# Patient Record
Sex: Female | Born: 1980 | Race: White | Hispanic: No | Marital: Married | Smoking: Never smoker
Health system: Southern US, Community
[De-identification: ages and names within clinical notes are randomized; demographics above are authoritative.]

## PROBLEM LIST (undated history)

## (undated) ENCOUNTER — Inpatient Hospital Stay (HOSPITAL_COMMUNITY): Payer: Self-pay

## (undated) DIAGNOSIS — Z8742 Personal history of other diseases of the female genital tract: Secondary | ICD-10-CM

## (undated) DIAGNOSIS — N859 Noninflammatory disorder of uterus, unspecified: Secondary | ICD-10-CM

## (undated) DIAGNOSIS — N809 Endometriosis, unspecified: Secondary | ICD-10-CM

## (undated) DIAGNOSIS — Z9889 Other specified postprocedural states: Secondary | ICD-10-CM

## (undated) DIAGNOSIS — K219 Gastro-esophageal reflux disease without esophagitis: Secondary | ICD-10-CM

## (undated) DIAGNOSIS — Z8619 Personal history of other infectious and parasitic diseases: Secondary | ICD-10-CM

## (undated) DIAGNOSIS — R112 Nausea with vomiting, unspecified: Secondary | ICD-10-CM

## (undated) DIAGNOSIS — Z973 Presence of spectacles and contact lenses: Secondary | ICD-10-CM

## (undated) DIAGNOSIS — R51 Headache: Secondary | ICD-10-CM

## (undated) HISTORY — PX: DILATION AND CURETTAGE OF UTERUS: SHX78

## (undated) HISTORY — PX: WISDOM TOOTH EXTRACTION: SHX21

## (undated) HISTORY — DX: Personal history of other infectious and parasitic diseases: Z86.19

---

## 2008-05-25 ENCOUNTER — Ambulatory Visit: Payer: Self-pay | Admitting: Family Medicine

## 2009-03-14 ENCOUNTER — Encounter: Payer: Self-pay | Admitting: Family Medicine

## 2009-03-29 ENCOUNTER — Ambulatory Visit: Payer: Self-pay | Admitting: Family Medicine

## 2009-03-29 DIAGNOSIS — K219 Gastro-esophageal reflux disease without esophagitis: Secondary | ICD-10-CM | POA: Insufficient documentation

## 2009-03-29 DIAGNOSIS — D179 Benign lipomatous neoplasm, unspecified: Secondary | ICD-10-CM | POA: Insufficient documentation

## 2009-03-29 DIAGNOSIS — R5383 Other fatigue: Secondary | ICD-10-CM

## 2009-03-29 DIAGNOSIS — R5381 Other malaise: Secondary | ICD-10-CM | POA: Insufficient documentation

## 2009-03-29 LAB — CONVERTED CEMR LAB
Nitrite: NEGATIVE
Specific Gravity, Urine: 1.025
Urobilinogen, UA: NEGATIVE
WBC Urine, dipstick: NEGATIVE

## 2009-04-01 ENCOUNTER — Encounter (INDEPENDENT_AMBULATORY_CARE_PROVIDER_SITE_OTHER): Payer: Self-pay | Admitting: *Deleted

## 2009-04-01 LAB — CONVERTED CEMR LAB
Basophils Absolute: 0 10*3/uL (ref 0.0–0.1)
Basophils Relative: 0 % (ref 0–1)
CO2: 24 meq/L (ref 19–32)
Calcium: 9.3 mg/dL (ref 8.4–10.5)
Creatinine, Ser: 0.72 mg/dL (ref 0.40–1.20)
Eosinophils Absolute: 0.1 10*3/uL (ref 0.0–0.7)
Folate: >20 ng/mL
Glucose, Bld: 100 mg/dL — ABNORMAL HIGH (ref 70–99)
Hemoglobin: 13.3 g/dL (ref 12.0–15.0)
MCHC: 34.1 g/dL (ref 30.0–36.0)
MCV: 87.1 fL (ref 78.0–100.0)
Monocytes Absolute: 0.5 10*3/uL (ref 0.1–1.0)
Monocytes Relative: 7 % (ref 3–12)
Neutro Abs: 5.4 10*3/uL (ref 1.7–7.7)
Neutrophils Relative %: 70 % (ref 43–77)
RDW: 12.7 % (ref 11.5–15.5)
Sodium: 139 meq/L (ref 135–145)
TSH: 2.164 microintl units/mL (ref 0.350–4.500)
Vit D, 25-Hydroxy: 30 ng/mL (ref 30–89)

## 2009-04-05 ENCOUNTER — Emergency Department (HOSPITAL_COMMUNITY): Admission: EM | Admit: 2009-04-05 | Discharge: 2009-04-05 | Payer: Self-pay | Admitting: Emergency Medicine

## 2009-07-25 ENCOUNTER — Ambulatory Visit (HOSPITAL_COMMUNITY): Admission: RE | Admit: 2009-07-25 | Discharge: 2009-07-25 | Payer: Self-pay | Admitting: Obstetrics and Gynecology

## 2009-07-25 HISTORY — PX: OTHER SURGICAL HISTORY: SHX169

## 2010-12-21 IMAGING — CR DG ABDOMEN ACUTE W/ 1V CHEST
4 series · 4 of 4 positions shown · non-contrast
Comparison: None

CLINICAL DATA: Abdominal and back pain.

ACUTE ABDOMEN SERIES (ABDOMEN 2 VIEW & CHEST 1 VIEW)

[w chest pa]
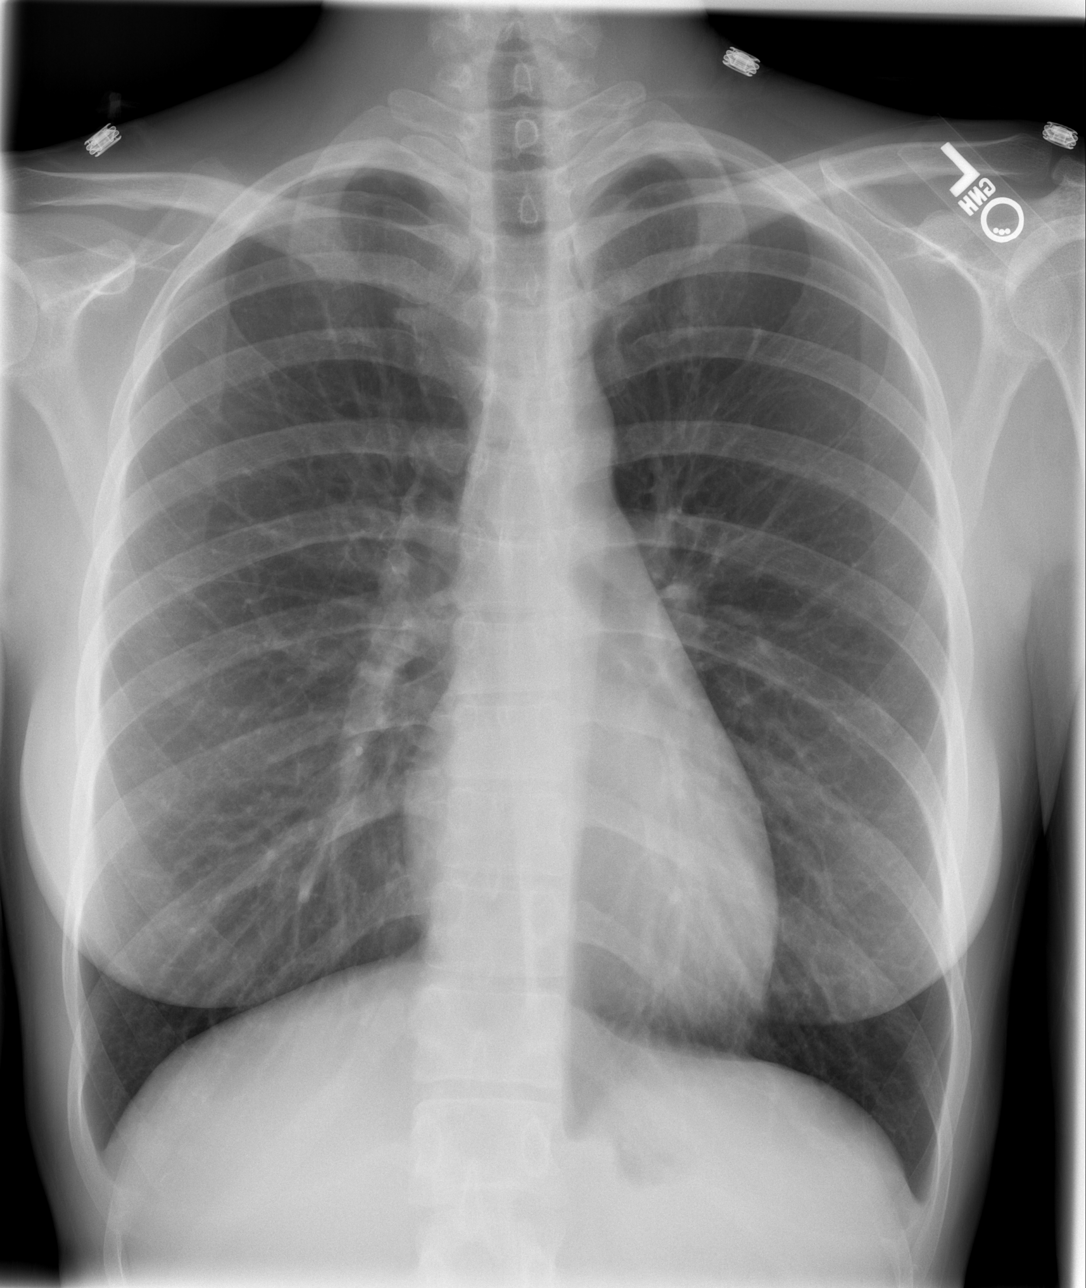

[w abdomen upright]
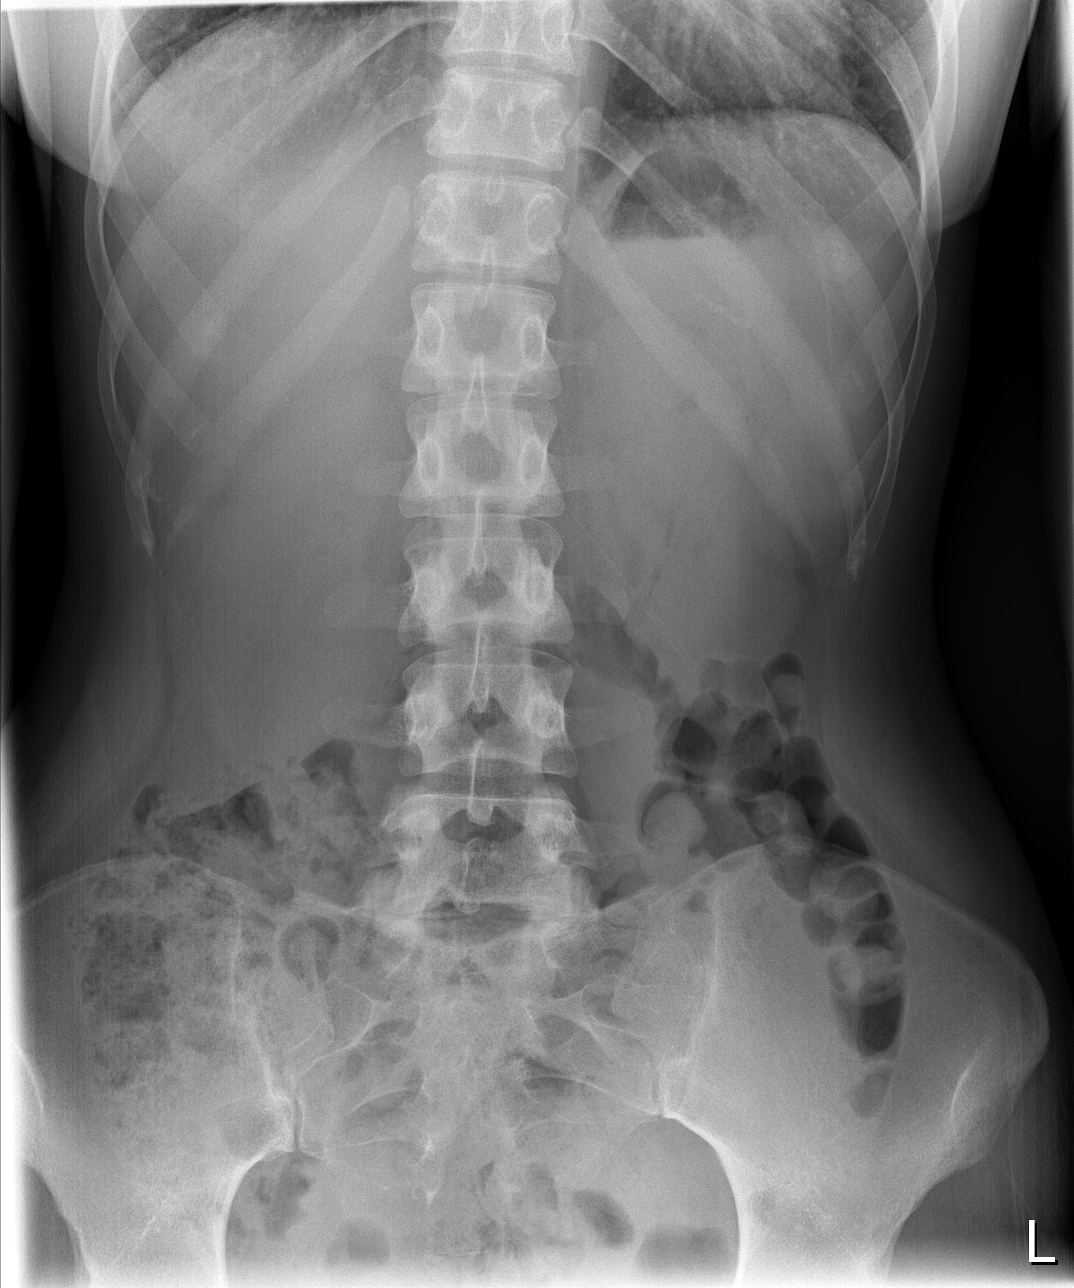

[t abdomen supine (1 of 2)]
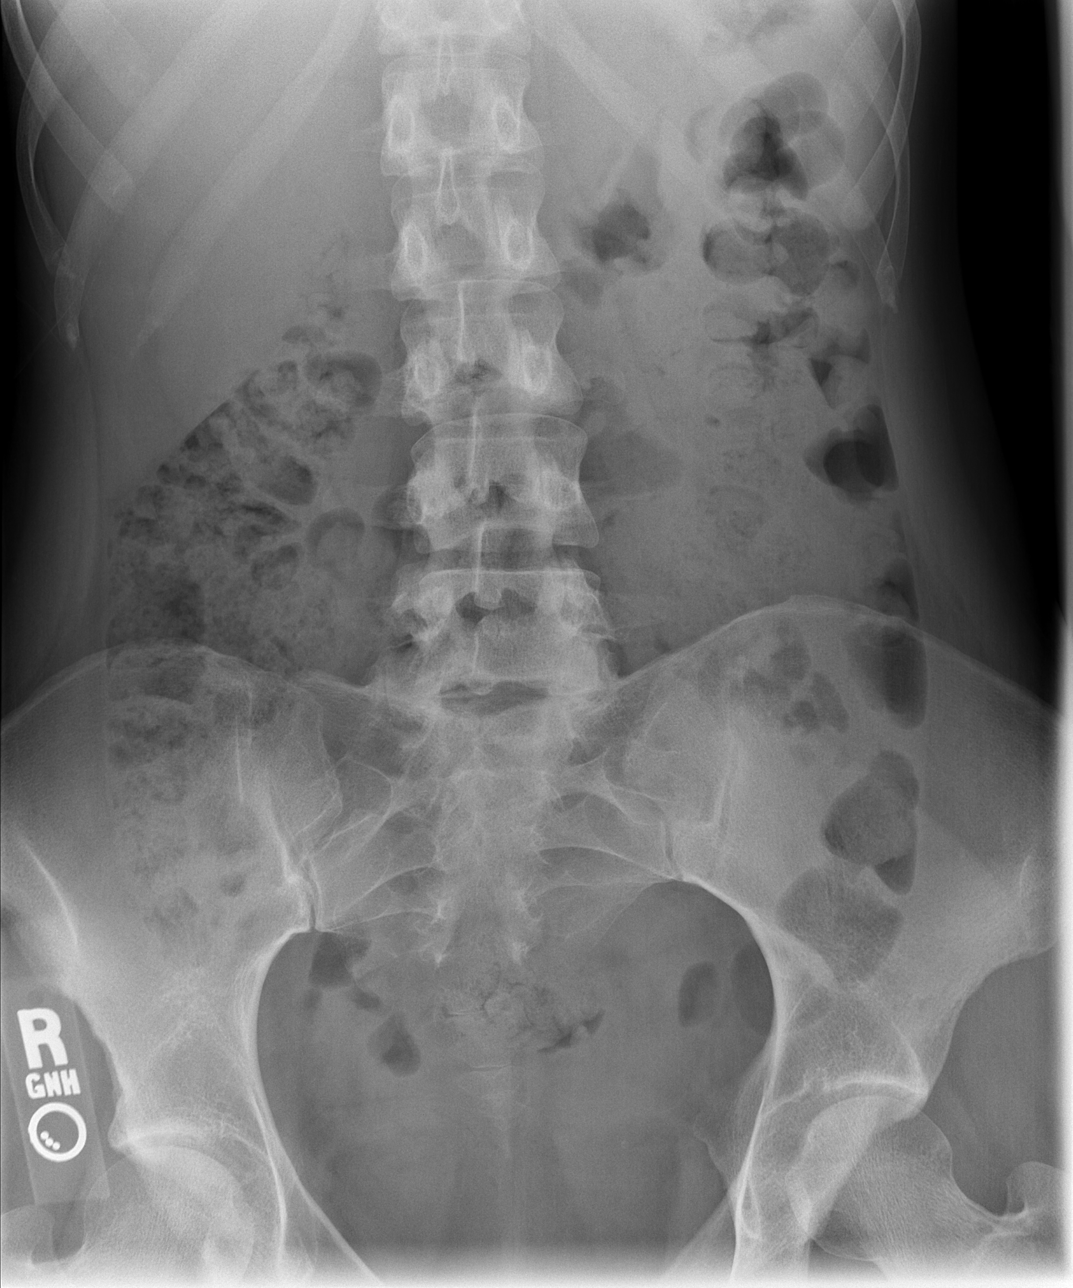

[t abdomen supine (2 of 2)]
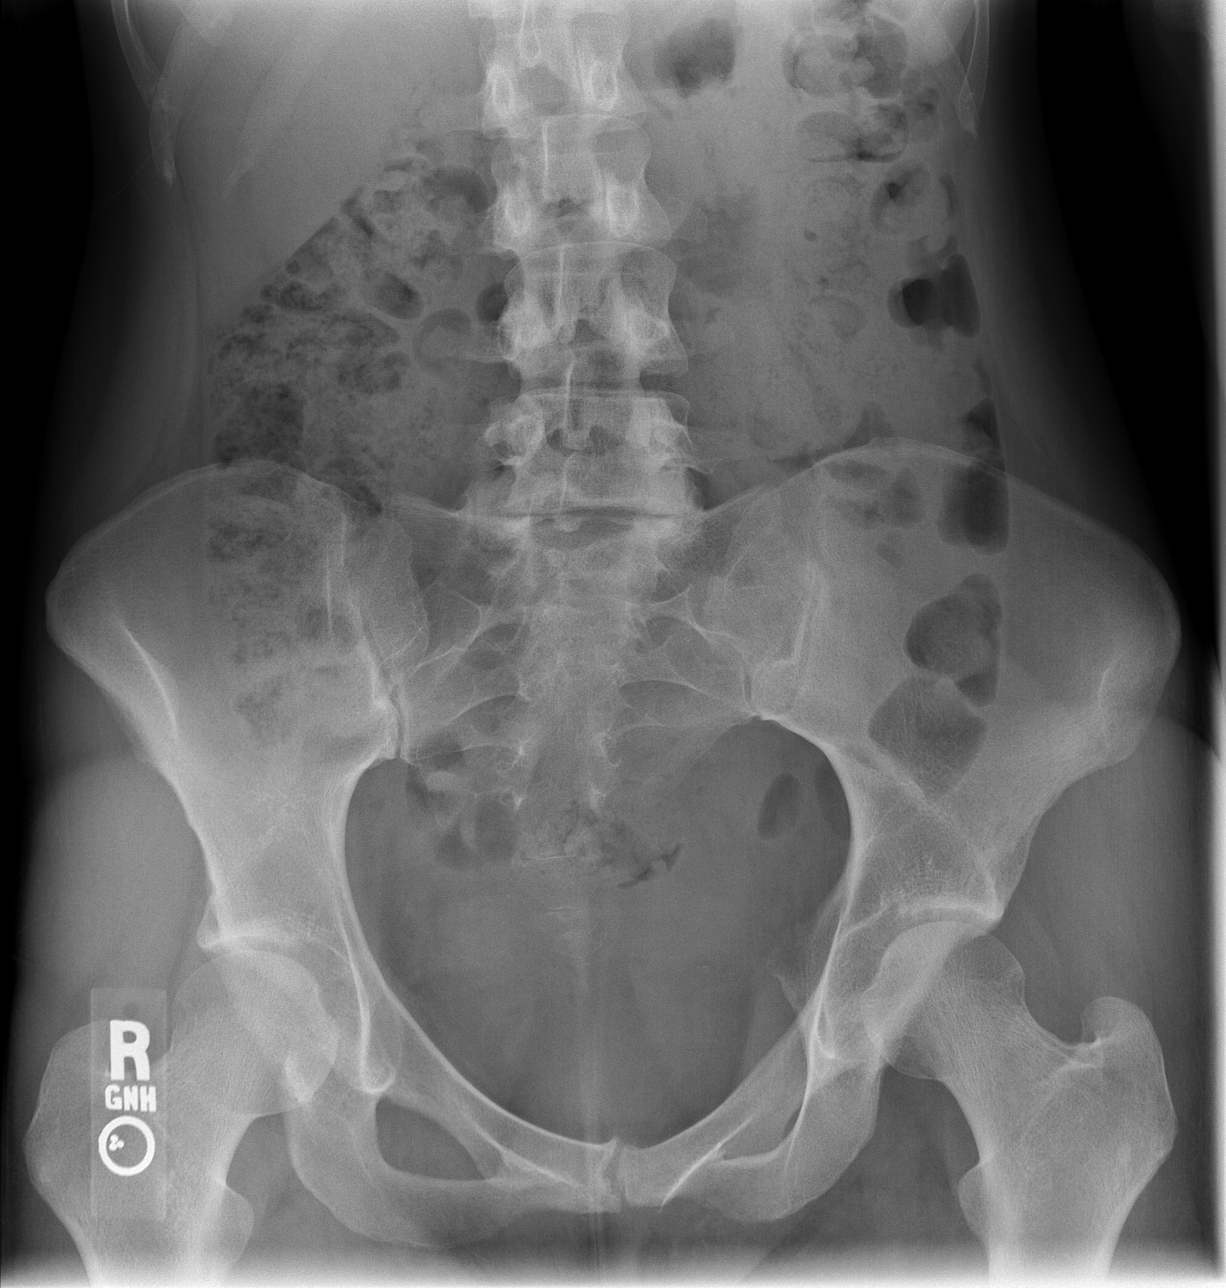

[4 of 4 positions shown; findings below may reference images not displayed]

FINDINGS: The chest radiograph demonstrates clear lungs.  Heart and
mediastinum are within normal limits.  The trachea is midline.  No
evidence for free air.  There is gas throughout the colon with a
moderate amount of stool in the right abdomen.  There are no large
abdominal calcifications.  There is no significant small bowel
dilatation.
IMPRESSION: No acute chest findings.

Moderate amount of stool in the abdomen.

## 2011-02-27 LAB — CBC
HCT: 39.6 % (ref 36.0–46.0)
Hemoglobin: 13.4 g/dL (ref 12.0–15.0)
MCV: 93.4 fL (ref 78.0–100.0)
Platelets: 180 10*3/uL (ref 150–400)
RBC: 4.24 MIL/uL (ref 3.87–5.11)
WBC: 5 10*3/uL (ref 4.0–10.5)

## 2011-02-27 LAB — HCG, SERUM, QUALITATIVE: Preg, Serum: NEGATIVE

## 2011-02-27 LAB — TYPE AND SCREEN

## 2011-02-27 LAB — CARBOXYHEMOGLOBIN
Carboxyhemoglobin: 0.9 % (ref 0.5–1.5)
O2 Saturation: 98 %
Total hemoglobin: 10.1 g/dL — ABNORMAL LOW (ref 12.5–16.0)

## 2011-02-27 LAB — ABO/RH: ABO/RH(D): O POS

## 2011-03-03 LAB — CBC
MCHC: 34.9 g/dL (ref 30.0–36.0)
RBC: 4.07 MIL/uL (ref 3.87–5.11)
RDW: 13.1 % (ref 11.5–15.5)

## 2011-03-03 LAB — URINALYSIS, ROUTINE W REFLEX MICROSCOPIC
Bilirubin Urine: NEGATIVE
Glucose, UA: NEGATIVE mg/dL
Hgb urine dipstick: NEGATIVE
Protein, ur: NEGATIVE mg/dL
Urobilinogen, UA: 0.2 mg/dL (ref 0.0–1.0)

## 2011-03-03 LAB — COMPREHENSIVE METABOLIC PANEL
ALT: 13 U/L (ref 0–35)
AST: 17 U/L (ref 0–37)
Alkaline Phosphatase: 27 U/L — ABNORMAL LOW (ref 39–117)
CO2: 26 mEq/L (ref 19–32)
Calcium: 8.6 mg/dL (ref 8.4–10.5)
GFR calc Af Amer: >60 mL/min (ref >60)
Potassium: 3.4 mEq/L — ABNORMAL LOW (ref 3.5–5.1)
Sodium: 136 mEq/L (ref 135–145)
Total Protein: 6.5 g/dL (ref 6.0–8.3)

## 2011-03-03 LAB — DIFFERENTIAL
Eosinophils Absolute: 0.1 10*3/uL (ref 0.0–0.7)
Eosinophils Relative: 2 % (ref 0–5)
Lymphs Abs: 1.8 10*3/uL (ref 0.7–4.0)
Monocytes Relative: 5 % (ref 3–12)

## 2011-03-03 LAB — URINE MICROSCOPIC-ADD ON

## 2011-03-03 LAB — POCT PREGNANCY, URINE: Preg Test, Ur: NEGATIVE

## 2011-04-07 NOTE — Op Note (Signed)
Victoria Maxwell, Victoria Maxwell NO.:  [id number]   MEDICAL RECORD NO.:  [id number]          PATIENT TYPE:  AMB   LOCATION:  [facility]                           FACILITY:  [facility]   PHYSICIAN:  Zelphia Cairo, MD    DATE OF BIRTH:  1981-03-02   DATE OF PROCEDURE:  07/25/2009  DATE OF DISCHARGE:  07/25/2009                               OPERATIVE REPORT   PREOPERATIVE DIAGNOSIS:  Persistent bilateral ovarian mass.   POSTOPERATIVE DIAGNOSES:  1. Endometriosis.  2. Left endometrioma.   PROCEDURES:  1. Diagnostic laparoscopy.  2. Drainage and partial cystectomy of left endometrioma.  3. Fulguration of endometriosis.   SURGEON:  Zelphia Cairo, MD   BLOOD LOSS:  Minimal.   ANESTHESIA:  General   FINDINGS:  Severe pelvic endometriosis with 5-cm left endometrioma  adhered to the left ovarian fossa, otherwise minimal pelvic adhesions.  Normal appendix, normal right upper quadrant.   COMPLICATIONS:  None.   SPECIMENS:  None.   CONDITION:  Stable and extubated to recovery room.   Victoria Maxwell was taken to the operating room where anesthesia was found to be  adequate.  She was prepped and draped in sterile fashion and a Foley  catheter was inserted sterilely.  Bivalve speculum was placed in the  vagina and a single-tooth tenaculum on the anterior lip of the cervix.  A Hulka clamp was placed on the cervix.  Tenaculum and speculum were  removed and our attention was turned to the abdomen.   An infraumbilical skin incision was made with a scalpel and extended  bluntly to the level of the fascia using a Kelly clamp.  Optical trocar  was then inserted under direct visualization.  Once intraperitoneal  placement was confirmed, CO2 was turned on and pneumoperitoneum was  achieved.  Survey of the abdomen and pelvis revealed a normal-appearing  right upper quadrant and normal-appearing appendix.  She had severe  endometriosis in the anterior cul-de-sac, posterior cul-de-sac involving  bilateral uterosacral ligaments and the entire peritoneum of the  posterior cul-de-sac.  She had powder burn marks on the right ovary and  approximately 5 to 5.5 cm ovarian mass adhered to the left ovarian  fossa.  Next, a suprapubic incision was made with a scalpel and a 5-mm  trocar was inserted under direct visualization.  The uterus was  manipulated towards the anterior abdominal wall.  A blunt probe was  inserted into the 5-mm trocar and the patient was placed in  Trendelenburg position.   When the left ovary was manipulated away from the left pelvic sidewall,  large amount of thick chocolate-colored fluid drained from the left  ovarian mass.  The left ovary was then grasped with a blunt grasper.  There was a plane between the left ovarian cyst, endometrioma cyst, and  the left ovarian tissue could not be identified because of severe  inflammation and scarring.  Using blunt dissection, the left ovary was  released from the left pelvic sidewall.  The pelvis was then irrigated.  Implants of endometriosis on the right ovary were cauterized using the  bipolar.  Because of  the diffused nature of the endometriosis implants  on the anterior and posterior peritoneum of the pelvis, fulguration was  not performed here.  I felt the patient would best be served with  postoperative Lupron.  All instruments and trocars were then removed  from the pelvis.  The fascia of the infraumbilical skin incision was  reapproximated with Vicryl.  The skin was closed with 3-0 Vicryl of both  the suprapubic and infraumbilical skin incisions.  Dermabond was placed  over the incision.  The Hulka clamp was removed.  The patient was then  taken to the recovery room in stable condition.  Sponge, lap, needle and  instrument counts were correct x2.      Zelphia Cairo, MD  Electronically Signed     GA/MEDQ  D:  07/26/2009  T:  07/26/2009  Job:  [id number]

## 2012-07-19 ENCOUNTER — Ambulatory Visit (INDEPENDENT_AMBULATORY_CARE_PROVIDER_SITE_OTHER): Payer: 59 | Admitting: Family Medicine

## 2012-07-19 ENCOUNTER — Ambulatory Visit: Payer: 59

## 2012-07-19 ENCOUNTER — Telehealth: Payer: Self-pay | Admitting: Radiology

## 2012-07-19 VITALS — BP 118/66 | HR 84 | Temp 98.3°F | Resp 16 | Ht 73.5 in | Wt 159.4 lb

## 2012-07-19 DIAGNOSIS — M25539 Pain in unspecified wrist: Secondary | ICD-10-CM

## 2012-07-19 NOTE — Telephone Encounter (Signed)
I have scheduled patient for appt with Dr Althea Charon at [facility]. It is for tomorrow at 11am, patient has been advised of appt, just want you to know so you can take out of Referrals. Victoria Maxwell

## 2012-07-19 NOTE — Progress Notes (Signed)
[facility] [address] [place] [address] [phone]  Date:  07/19/2012   Name:  Victoria Maxwell   DOB:  [birth year]   MRN:  [id number]  PCP:  Loreen Freud, DO    Chief Complaint: Wrist Pain   History of Present Illness:  Victoria Maxwell is a 31 y.o. very pleasant female patient who presents with the following:  Here with recurrent right wrist pain- no known injury.  This has happened to her in the past but has resolved with use of an OTC wrist splint.  This time the pain has not resolved, and she has noted onset of a hard bump on the dorsum of her wrist that is most visible with flexion of the wrist.  The bump is tender.  Never had this bump in the past.    No numbness or tingling of her hand to suggest CTS, no weakness or coordination problems.   She and her husband are tying to conceive- however she just had a normal period on the 7th of this month, so she is unlikely to be at any risk of harm from x-ray.  Did try to get a urine HCG but she was unable to urinate and felt comfortable going ahead with an x-ray.   Patient Active Problem List  Diagnosis  . LIPOMA  . GERD  . FATIGUE    No past medical history on file.  No past surgical history on file.  History  Substance Use Topics  . Smoking status: Never Smoker   . Smokeless tobacco: Not on file  . Alcohol Use: Not on file    No family history on file.  No Known Allergies  Medication list has been reviewed and updated.  No current outpatient prescriptions on file prior to visit.    Review of Systems:  As per HPI- otherwise negative.   Physical Examination: Filed Vitals:   07/19/12 0834  BP: 118/66  Pulse: 84  Temp: 98.3 F (36.8 C)  Resp: 16   Filed Vitals:   07/19/12 0834  Height: 6' 1.5" (1.867 m)  Weight: 159 lb 6.4 oz (72.303 kg)   Body mass index is 20.75 kg/(m^2). Ideal Body Weight: Weight in (lb) to have BMI = 25: 191.7   GEN: WDWN, NAD, Non-toxic, A & O x  3 HEENT: Atraumatic, Normocephalic. Neck supple. No masses, No LAD. Ears and Nose: No external deformity. CV: RRR, No M/G/R. No JVD. No thrill. No extra heart sounds. PULM: CTA B, no wheezes, crackles, rhonchi. No retractions. No resp. distress. No accessory muscle use. EXTR: No c/c/e NEURO Normal gait.  PSYCH: Normally interactive. Conversant. Not depressed or anxious appearing.  Calm demeanor.  Right wrist:  There is a tender, firm bump on the dorsum of the wrist- does not seem to be a ganglion cyst.  Most visible with flexion of the wrist.  The wrist is tender with extension, but has full ROM and strength.  Unable to cause CTS symptoms with percussion of median nerve.  No redness, swelling or skin lesion.   Left wrist normal   [facility] reading (PRIMARY) by  Dr. Patsy Lager. Left wrist (for comparison)- negative Right wrist: negative   Assessment and Plan: 1. Wrist pain  DG Wrist 2 Views Left, DG Wrist Complete Right, Ambulatory referral to Orthopedic Surgery    Right wrist pain- on and off, but persistent now for one month.  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    She is wearing on OTC wrist splint that is helpful.  Called and was able to get her an appt to see Dr. Althea Charon tomorrow- appreciate his consultation on this nice patient.    Abbe Amsterdam, MD

## 2013-11-24 ENCOUNTER — Encounter (HOSPITAL_COMMUNITY): Payer: Self-pay | Admitting: Pharmacist

## 2013-11-27 ENCOUNTER — Inpatient Hospital Stay (HOSPITAL_COMMUNITY): Payer: 59

## 2013-11-27 ENCOUNTER — Encounter (HOSPITAL_COMMUNITY): Payer: 59

## 2013-11-27 ENCOUNTER — Encounter (HOSPITAL_COMMUNITY): Payer: Self-pay | Admitting: *Deleted

## 2013-11-27 ENCOUNTER — Ambulatory Visit (HOSPITAL_COMMUNITY)
Admission: AD | Admit: 2013-11-27 | Discharge: 2013-11-27 | Disposition: A | Discharge status: Home / Self Care | Payer: 59 | Source: Ambulatory Visit | Attending: Obstetrics and Gynecology | Admitting: Obstetrics and Gynecology

## 2013-11-27 ENCOUNTER — Encounter (HOSPITAL_COMMUNITY): Payer: Self-pay

## 2013-11-27 ENCOUNTER — Encounter (HOSPITAL_COMMUNITY)
Admission: AD | Disposition: A | Discharge status: Home / Self Care | Payer: Self-pay | Source: Ambulatory Visit | Attending: Obstetrics and Gynecology

## 2013-11-27 DIAGNOSIS — O034 Incomplete spontaneous abortion without complication: Secondary | ICD-10-CM | POA: Insufficient documentation

## 2013-11-27 DIAGNOSIS — Q513 Bicornate uterus: Secondary | ICD-10-CM | POA: Insufficient documentation

## 2013-11-27 HISTORY — DX: Headache: R51

## 2013-11-27 HISTORY — PX: DILATION AND EVACUATION: SHX1459

## 2013-11-27 HISTORY — DX: Other specified postprocedural states: R11.2

## 2013-11-27 HISTORY — DX: Other specified postprocedural states: Z98.890

## 2013-11-27 HISTORY — DX: Endometriosis, unspecified: N80.9

## 2013-11-27 LAB — CBC
HCT: 37.3 % (ref 36.0–46.0)
HEMOGLOBIN: 12.9 g/dL (ref 12.0–15.0)
MCH: 30.6 pg (ref 26.0–34.0)
MCHC: 34.6 g/dL (ref 30.0–36.0)
MCV: 88.6 fL (ref 78.0–100.0)
Platelets: 176 10*3/uL (ref 150–400)
RBC: 4.21 MIL/uL (ref 3.87–5.11)
RDW: 12.2 % (ref 11.5–15.5)
WBC: 9.1 10*3/uL (ref 4.0–10.5)

## 2013-11-27 LAB — TYPE AND SCREEN
ABO/RH(D): O POS
ANTIBODY SCREEN: NEGATIVE

## 2013-11-27 SURGERY — DILATION AND EVACUATION, UTERUS
Anesthesia: Monitor Anesthesia Care | Site: Uterus

## 2013-11-27 MED ORDER — CITRIC ACID-SODIUM CITRATE 334-500 MG/5ML PO SOLN
30.0000 mL | Freq: Once | ORAL | Status: AC
  Administered 2013-11-27: 30 mL via ORAL
  Filled 2013-11-27: qty 15

## 2013-11-27 MED ORDER — FAMOTIDINE IN NACL 20-0.9 MG/50ML-% IV SOLN
20.0000 mg | Freq: Once | INTRAVENOUS | Status: AC
  Administered 2013-11-27: 20 mg via INTRAVENOUS
  Filled 2013-11-27: qty 50

## 2013-11-27 MED ORDER — DEXAMETHASONE SODIUM PHOSPHATE 10 MG/ML IJ SOLN
INTRAMUSCULAR | Status: AC
  Filled 2013-11-27: qty 1

## 2013-11-27 MED ORDER — MIDAZOLAM HCL 2 MG/2ML IJ SOLN: 0.5000 mg | Freq: Once | INTRAMUSCULAR | Status: DC | PRN

## 2013-11-27 MED ORDER — PROPOFOL 10 MG/ML IV EMUL
INTRAVENOUS | Status: AC
  Filled 2013-11-27: qty 20

## 2013-11-27 MED ORDER — LIDOCAINE HCL (CARDIAC) 20 MG/ML IV SOLN
INTRAVENOUS | Status: AC
  Filled 2013-11-27: qty 5

## 2013-11-27 MED ORDER — MIDAZOLAM HCL 2 MG/2ML IJ SOLN
INTRAMUSCULAR | Status: DC | PRN
  Administered 2013-11-27: 2 mg via INTRAVENOUS

## 2013-11-27 MED ORDER — MIDAZOLAM HCL 2 MG/2ML IJ SOLN
INTRAMUSCULAR | Status: AC
  Filled 2013-11-27: qty 2

## 2013-11-27 MED ORDER — PROMETHAZINE HCL 25 MG/ML IJ SOLN: 6.2500 mg | INTRAMUSCULAR | Status: DC | PRN

## 2013-11-27 MED ORDER — MEPERIDINE HCL 25 MG/ML IJ SOLN: 6.2500 mg | INTRAMUSCULAR | Status: DC | PRN

## 2013-11-27 MED ORDER — DEXAMETHASONE SODIUM PHOSPHATE 10 MG/ML IJ SOLN
INTRAMUSCULAR | Status: DC | PRN
  Administered 2013-11-27: 5 mg via INTRAVENOUS

## 2013-11-27 MED ORDER — PROPOFOL 10 MG/ML IV EMUL
INTRAVENOUS | Status: DC | PRN
  Administered 2013-11-27 (×2): 40 mg via INTRAVENOUS

## 2013-11-27 MED ORDER — FENTANYL CITRATE 0.05 MG/ML IJ SOLN
INTRAMUSCULAR | Status: AC
  Filled 2013-11-27: qty 2

## 2013-11-27 MED ORDER — LACTATED RINGERS IV SOLN
INTRAVENOUS | Status: DC
  Administered 2013-11-27 (×2): via INTRAVENOUS

## 2013-11-27 MED ORDER — KETOROLAC TROMETHAMINE 30 MG/ML IJ SOLN
INTRAMUSCULAR | Status: DC | PRN
  Administered 2013-11-27: 30 mg via INTRAVENOUS

## 2013-11-27 MED ORDER — LACTATED RINGERS IV BOLUS (SEPSIS)
1000.0000 mL | Freq: Once | INTRAVENOUS | Status: AC
  Administered 2013-11-27: 1000 mL via INTRAVENOUS

## 2013-11-27 MED ORDER — ONDANSETRON HCL 4 MG/2ML IJ SOLN
INTRAMUSCULAR | Status: DC | PRN
  Administered 2013-11-27: 4 mg via INTRAVENOUS

## 2013-11-27 MED ORDER — IBUPROFEN 600 MG PO TABS: ORAL_TABLET | ORAL | Status: DC

## 2013-11-27 MED ORDER — ONDANSETRON HCL 4 MG/2ML IJ SOLN
INTRAMUSCULAR | Status: AC
  Filled 2013-11-27: qty 2

## 2013-11-27 MED ORDER — METHYLERGONOVINE MALEATE 0.2 MG PO TABS: 0.2000 mg | ORAL_TABLET | Freq: Four times a day (QID) | ORAL | Status: DC

## 2013-11-27 MED ORDER — CHLOROPROCAINE HCL 1 % IJ SOLN: INTRAMUSCULAR | Status: DC | PRN

## 2013-11-27 MED ORDER — FENTANYL CITRATE 0.05 MG/ML IJ SOLN
INTRAMUSCULAR | Status: DC | PRN
  Administered 2013-11-27: 100 ug via INTRAVENOUS
  Administered 2013-11-27: 50 ug via INTRAVENOUS

## 2013-11-27 MED ORDER — LIDOCAINE HCL (CARDIAC) 20 MG/ML IV SOLN
INTRAVENOUS | Status: DC | PRN
  Administered 2013-11-27: 20 mg via INTRAVENOUS

## 2013-11-27 MED ORDER — KETOROLAC TROMETHAMINE 30 MG/ML IJ SOLN: 15.0000 mg | Freq: Once | INTRAMUSCULAR | Status: DC | PRN

## 2013-11-27 MED ORDER — FENTANYL CITRATE 0.05 MG/ML IJ SOLN: 25.0000 ug | INTRAMUSCULAR | Status: DC | PRN

## 2013-11-27 MED ORDER — PROPOFOL INFUSION 10 MG/ML OPTIME
INTRAVENOUS | Status: DC | PRN
  Administered 2013-11-27: 120 ug/kg/min via INTRAVENOUS

## 2013-11-27 SURGICAL SUPPLY — 21 items
CATH ROBINSON RED A/P 16FR (CATHETERS) ×3 IMPLANT
CLOTH BEACON ORANGE TIMEOUT ST (SAFETY) ×3 IMPLANT
DECANTER SPIKE VIAL GLASS SM (MISCELLANEOUS) ×3 IMPLANT
GLOVE BIO SURGEON STRL SZ 6.5 (GLOVE) ×2 IMPLANT
GLOVE BIO SURGEONS STRL SZ 6.5 (GLOVE) ×1
GLOVE BIOGEL PI IND STRL 7.0 (GLOVE) ×1 IMPLANT
GLOVE BIOGEL PI INDICATOR 7.0 (GLOVE) ×2
GOWN STRL REIN XL XLG (GOWN DISPOSABLE) ×6 IMPLANT
KIT BERKELEY 1ST TRIMESTER 3/8 (MISCELLANEOUS) ×3 IMPLANT
NEEDLE SPNL 22GX3.5 QUINCKE BK (NEEDLE) ×3 IMPLANT
NS IRRIG 1000ML POUR BTL (IV SOLUTION) ×3 IMPLANT
PACK VAGINAL MINOR WOMEN LF (CUSTOM PROCEDURE TRAY) ×3 IMPLANT
PAD OB MATERNITY 4.3X12.25 (PERSONAL CARE ITEMS) ×3 IMPLANT
PAD PREP 24X48 CUFFED NSTRL (MISCELLANEOUS) ×3 IMPLANT
SET BERKELEY SUCTION TUBING (SUCTIONS) ×3 IMPLANT
SYR CONTROL 10ML LL (SYRINGE) ×3 IMPLANT
TOWEL OR 17X24 6PK STRL BLUE (TOWEL DISPOSABLE) ×6 IMPLANT
VACURETTE 10 RIGID CVD (CANNULA) IMPLANT
VACURETTE 7MM CVD STRL WRAP (CANNULA) IMPLANT
VACURETTE 8 RIGID CVD (CANNULA) IMPLANT
VACURETTE 9 RIGID CVD (CANNULA) IMPLANT

## 2013-11-27 NOTE — Discharge Instructions (Signed)
[facility] office 2-3 weeks for postop appointment.  Call the office [phone] for an appointment.  Personal Hygiene: Use pads not tampons x 1week You may shower, no tub baths or pools for 2-3 weeks Wipe from front to back when using restroom  Activity: Do not drive or operate any equipment for 24 hrs.   Do not rest in bed all day Walking is encouraged Walk up and down stairs slowly You may return to your normal activity in 1-2 days  Sexual Activity:  No intercourse for 2 weeks after the procedure.  Diet: Eat a light meal as desired this evening.  You may resume your usual diet tomorrow.  Return to work:  You may resume your work activities after 1-2 days  What to expect:  Expect to have vaginal bleeding/discharge for 2-3 days and spotting for 10-14 days.  It is not unusual to have soreness for 1-2 weeks.  You may have a slight burning sensation when you urinate for the first few days.  You may start your menses in 2-6 weeks.  Mild cramps may continue for a couple of days.    Call your doctor:   Excessive bleeding, saturating a pad every hour Inability to urinate 6 hours after discharge Pain not relieved with pain medications Fever of 100.4 or greater DISCHARGE INSTRUCTIONS: D&C / D&E The following instructions have been prepared to help you care for yourself upon your return home.   Personal hygiene:  Use sanitary pads for vaginal drainage, not tampons.  Shower the day after your procedure.  NO tub baths, pools or Jacuzzis for 2-3 weeks.  Wipe front to back after using the bathroom.  Activity and limitations:  Do NOT drive or operate any equipment for 24 hours. The effects of anesthesia are still present and drowsiness may result.  Do NOT rest in bed all day.  Walking is encouraged.  Walk up and down stairs slowly.  You may resume your normal activity in one to two days or as indicated by your physician.  Sexual activity: NO intercourse for at least 2 weeks after the  procedure, or as indicated by your physician.  Diet: Eat a light meal as desired this evening. You may resume your usual diet tomorrow.  Return to work: You may resume your work activities in one to two days or as indicated by your doctor.  What to expect after your surgery: Expect to have vaginal bleeding/discharge for 2-3 days and spotting for up to 10 days. It is not unusual to have soreness for up to 1-2 weeks. You may have a slight burning sensation when you urinate for the first day. Mild cramps may continue for a couple of days. You may have a regular period in 2-6 weeks.  Call your doctor for any of the following:  Excessive vaginal bleeding, saturating and changing one pad every hour.  Inability to urinate 6 hours after discharge from hospital.  Pain not relieved by pain medication.  Fever of 100.4 F or greater.  Unusual vaginal discharge or odor.   Call for an appointment:    Patients signature: ______________________  Nurses signature ________________________  Support person's signature______________________

## 2013-11-27 NOTE — H&P (Signed)
33 yo G2P0 with incomplete Ab presents for surgical mngt.  Pt with known bicornuate uterus  PMHx:  Neg PSHx:  Ovarian cystectomy All:  nka Meds:  PNV SHx:  Negative for tobacco  AF, VSS Gen - NAD CV - RRR Lungs - clear Abd - soft, NT PV - heavy bleeding, clot at cervical os  [place]:  Large clot & collapsed IUGS in LUS Blood type O+  A/P:  Incompete Ab D&E D&E

## 2013-11-27 NOTE — OR Nursing (Signed)
U/S arrived to OR #3 at 1912

## 2013-11-27 NOTE — Transfer of Care (Signed)
Immediate Anesthesia Transfer of Care Note  Patient: Victoria Maxwell  Procedure(s) Performed: Procedure(s): DILATATION AND EVACUATION (N/A)  Patient Location: PACU  Anesthesia Type:MAC  Level of Consciousness: awake, alert  and oriented  Airway & Oxygen Therapy: Patient Spontanous Breathing and Patient connected to nasal cannula oxygen  Post-op Assessment: Report given to PACU RN and Post -op Vital signs reviewed and stable  Post vital signs: Reviewed and stable  Complications: No apparent anesthesia complications

## 2013-11-27 NOTE — Anesthesia Preprocedure Evaluation (Addendum)
Anesthesia Evaluation  Patient identified by MRN, date of birth, ID band Patient awake    Reviewed: Allergy & Precautions, H&P , Patient's Chart, lab work & pertinent test results, reviewed documented beta blocker date and time   History of Anesthesia Complications Negative for: history of anesthetic complications  Airway Mallampati: II TM Distance: >3 FB Neck ROM: full    Dental   Pulmonary  breath sounds clear to auscultation        Cardiovascular Exercise Tolerance: Good Rhythm:regular Rate:Normal     Neuro/Psych negative psych ROS   GI/Hepatic GERD-  Controlled,  Endo/Other    Renal/GU      Musculoskeletal   Abdominal   Peds  Hematology   Anesthesia Other Findings   Reproductive/Obstetrics                           Anesthesia Physical Anesthesia Plan  ASA: II and emergent  Anesthesia Plan: MAC   Post-op Pain Management:    Induction:   Airway Management Planned:   Additional Equipment:   Intra-op Plan:   Post-operative Plan:   Informed Consent: I have reviewed the patients History and Physical, chart, labs and discussed the procedure including the risks, benefits and alternatives for the proposed anesthesia with the patient or authorized representative who has indicated his/her understanding and acceptance.   Dental Advisory Given  Plan Discussed with: CRNA, Surgeon and Anesthesiologist  Anesthesia Plan Comments:        Anesthesia Quick Evaluation

## 2013-11-28 ENCOUNTER — Encounter (HOSPITAL_COMMUNITY): Admission: RE | Payer: Self-pay | Source: Ambulatory Visit

## 2013-11-28 ENCOUNTER — Ambulatory Visit (HOSPITAL_COMMUNITY): Admission: RE | Admit: 2013-11-28 | Payer: 59 | Source: Ambulatory Visit | Admitting: Obstetrics and Gynecology

## 2013-11-28 SURGERY — DILATION AND EVACUATION, UTERUS
Anesthesia: Choice

## 2013-11-28 NOTE — Op Note (Signed)
NAMESUZANE, VANDERWEIDE NO.:  [id number]  MEDICAL RECORD NO.:  [id number]  LOCATION:  [facility]                          FACILITY:  [facility]  PHYSICIAN:  Marylynn Pearson, MD    DATE OF BIRTH:  [birth year]  DATE OF PROCEDURE: DATE OF DISCHARGE:  11/27/2013                              OPERATIVE REPORT   PREOPERATIVE DIAGNOSIS:  Incomplete abortion.  POSTOPERATIVE DIAGNOSIS:  Incomplete abortion, pathology pending.  PROCEDURE: 1. Intraoperative ultrasound. 2. Evacuation of incomplete miscarriage.  SURGEON:  Marylynn Pearson, MD.  ANESTHESIA:  MAC.  COMPLICATIONS:  None.  CONDITION:  Stable to recovery room.  DESCRIPTION OF PROCEDURE:  The patient was taken to the operating room after informed consent was obtained.  She was given anesthesia and placed in the dorsal lithotomy position using Allen stirrups.  She was prepped and draped in sterile fashion.  An in and out catheter was used to drain her bladder for 50 mL of clear urine.  Bivalve speculum was placed in the vagina.  Upon entering the vagina, large amount of products of conception were noted within the cervix.  These were grasped with ring forceps and evacuated from the uterus, placed in a specimen container.  After this, a significant reduction in uterine bleeding was seen and therefore ultrasound was called in the room to evaluate for any retained products of conception.  Transvaginal ultrasound was performed, a thin endometrial stripe was noted throughout.  No color no increase color-flow indicating products of conception.  For this reason, a curettage was not performed.  She was taken to the recovery room in stable condition.  Sponge, lap, needle, and instrument counts were correct x2.     Marylynn Pearson, MD     GA/MEDQ  D:  11/27/2013  T:  11/28/2013  Job:  [id number]

## 2013-11-29 ENCOUNTER — Encounter (HOSPITAL_COMMUNITY): Payer: Self-pay | Admitting: Obstetrics and Gynecology

## 2013-11-29 NOTE — Anesthesia Postprocedure Evaluation (Signed)
  Anesthesia Post-op Note  Patient: Victoria Maxwell  Procedure(s) Performed: Procedure(s): DILATATION AND EVACUATION (N/A) Patient is awake and responsive. Pain and nausea are reasonably well controlled. Vital signs are stable and clinically acceptable. Oxygen saturation is clinically acceptable. There are no apparent anesthetic complications at this time. Patient is ready for discharge.

## 2013-12-14 LAB — CHROMOSOME STD, POC(TISSUE)-NCBH

## 2014-05-15 ENCOUNTER — Encounter (HOSPITAL_COMMUNITY): Payer: Self-pay

## 2014-09-14 ENCOUNTER — Encounter (HOSPITAL_BASED_OUTPATIENT_CLINIC_OR_DEPARTMENT_OTHER): Payer: Self-pay | Admitting: *Deleted

## 2014-09-17 ENCOUNTER — Encounter (HOSPITAL_BASED_OUTPATIENT_CLINIC_OR_DEPARTMENT_OTHER): Payer: Self-pay | Admitting: *Deleted

## 2014-09-17 NOTE — Progress Notes (Signed)
NPO AFTER MN WITH EXCEPTION CLEAR LIQUIDS UNTIL 0900 (NO CREAM/ MILK PRODUCTS). ARRIVE AT 3086. NEEDS HG AND URINE PREG.

## 2014-09-20 ENCOUNTER — Encounter (HOSPITAL_BASED_OUTPATIENT_CLINIC_OR_DEPARTMENT_OTHER): Payer: 59 | Admitting: Anesthesiology

## 2014-09-20 ENCOUNTER — Ambulatory Visit (HOSPITAL_BASED_OUTPATIENT_CLINIC_OR_DEPARTMENT_OTHER)
Admission: RE | Admit: 2014-09-20 | Discharge: 2014-09-20 | Disposition: A | Discharge status: Home / Self Care | Payer: 59 | Source: Ambulatory Visit | Attending: Obstetrics and Gynecology | Admitting: Obstetrics and Gynecology

## 2014-09-20 ENCOUNTER — Encounter (HOSPITAL_BASED_OUTPATIENT_CLINIC_OR_DEPARTMENT_OTHER)
Admission: RE | Disposition: A | Discharge status: Home / Self Care | Payer: Self-pay | Source: Ambulatory Visit | Attending: Obstetrics and Gynecology

## 2014-09-20 ENCOUNTER — Encounter (HOSPITAL_BASED_OUTPATIENT_CLINIC_OR_DEPARTMENT_OTHER): Payer: Self-pay

## 2014-09-20 ENCOUNTER — Ambulatory Visit (HOSPITAL_BASED_OUTPATIENT_CLINIC_OR_DEPARTMENT_OTHER): Payer: 59 | Admitting: Anesthesiology

## 2014-09-20 DIAGNOSIS — K219 Gastro-esophageal reflux disease without esophagitis: Secondary | ICD-10-CM | POA: Diagnosis not present

## 2014-09-20 DIAGNOSIS — N840 Polyp of corpus uteri: Secondary | ICD-10-CM | POA: Diagnosis not present

## 2014-09-20 DIAGNOSIS — Q512 Other doubling of uterus: Secondary | ICD-10-CM | POA: Diagnosis present

## 2014-09-20 DIAGNOSIS — N96 Recurrent pregnancy loss: Secondary | ICD-10-CM | POA: Insufficient documentation

## 2014-09-20 DIAGNOSIS — Z809 Family history of malignant neoplasm, unspecified: Secondary | ICD-10-CM | POA: Diagnosis not present

## 2014-09-20 DIAGNOSIS — Z832 Family history of diseases of the blood and blood-forming organs and certain disorders involving the immune mechanism: Secondary | ICD-10-CM | POA: Insufficient documentation

## 2014-09-20 DIAGNOSIS — N800 Endometriosis of uterus: Secondary | ICD-10-CM | POA: Insufficient documentation

## 2014-09-20 DIAGNOSIS — K910 Vomiting following gastrointestinal surgery: Secondary | ICD-10-CM | POA: Insufficient documentation

## 2014-09-20 DIAGNOSIS — R51 Headache: Secondary | ICD-10-CM | POA: Diagnosis not present

## 2014-09-20 DIAGNOSIS — Z8349 Family history of other endocrine, nutritional and metabolic diseases: Secondary | ICD-10-CM | POA: Diagnosis not present

## 2014-09-20 HISTORY — DX: Gastro-esophageal reflux disease without esophagitis: K21.9

## 2014-09-20 HISTORY — DX: Personal history of other diseases of the female genital tract: Z87.42

## 2014-09-20 HISTORY — DX: Presence of spectacles and contact lenses: Z97.3

## 2014-09-20 HISTORY — DX: Noninflammatory disorder of uterus, unspecified: N85.9

## 2014-09-20 HISTORY — PX: HYSTEROSCOPY: SHX211

## 2014-09-20 LAB — POCT PREGNANCY, URINE: Preg Test, Ur: NEGATIVE

## 2014-09-20 LAB — POCT HEMOGLOBIN-HEMACUE: Hemoglobin: 13.6 g/dL (ref 12.0–15.0)

## 2014-09-20 SURGERY — HYSTEROSCOPY
Anesthesia: General | Site: Vagina

## 2014-09-20 MED ORDER — SODIUM CHLORIDE 0.9 % IR SOLN
Status: DC | PRN
  Administered 2014-09-20: 3000 mL

## 2014-09-20 MED ORDER — FENTANYL CITRATE 0.05 MG/ML IJ SOLN
INTRAMUSCULAR | Status: AC
  Filled 2014-09-20: qty 2

## 2014-09-20 MED ORDER — MIDAZOLAM HCL 2 MG/2ML IJ SOLN
INTRAMUSCULAR | Status: AC
  Filled 2014-09-20: qty 2

## 2014-09-20 MED ORDER — ACETAMINOPHEN 10 MG/ML IV SOLN
INTRAVENOUS | Status: DC | PRN
  Administered 2014-09-20: 1000 mg via INTRAVENOUS

## 2014-09-20 MED ORDER — ONDANSETRON HCL 4 MG/2ML IJ SOLN
INTRAMUSCULAR | Status: DC | PRN
  Administered 2014-09-20: 4 mg via INTRAVENOUS

## 2014-09-20 MED ORDER — ESTRADIOL 2 MG PO TABS: 3.0000 mg | ORAL_TABLET | Freq: Every day | ORAL | Status: DC

## 2014-09-20 MED ORDER — CEFAZOLIN SODIUM-DEXTROSE 2-3 GM-% IV SOLR
2.0000 g | INTRAVENOUS | Status: AC
  Administered 2014-09-20: 2 g via INTRAVENOUS
  Filled 2014-09-20: qty 50

## 2014-09-20 MED ORDER — LIDOCAINE HCL (CARDIAC) 20 MG/ML IV SOLN
INTRAVENOUS | Status: DC | PRN
  Administered 2014-09-20: 100 mg via INTRAVENOUS

## 2014-09-20 MED ORDER — DEXAMETHASONE SODIUM PHOSPHATE 4 MG/ML IJ SOLN
INTRAMUSCULAR | Status: DC | PRN
  Administered 2014-09-20: 10 mg via INTRAVENOUS

## 2014-09-20 MED ORDER — MIDAZOLAM HCL 5 MG/5ML IJ SOLN
INTRAMUSCULAR | Status: DC | PRN
  Administered 2014-09-20: 2 mg via INTRAVENOUS

## 2014-09-20 MED ORDER — ONDANSETRON HCL 4 MG/2ML IJ SOLN
INTRAMUSCULAR | Status: AC
  Filled 2014-09-20: qty 2

## 2014-09-20 MED ORDER — LACTATED RINGERS IV SOLN
INTRAVENOUS | Status: DC
  Administered 2014-09-20 (×2): via INTRAVENOUS
  Filled 2014-09-20: qty 1000

## 2014-09-20 MED ORDER — ONDANSETRON HCL 4 MG/2ML IJ SOLN
4.0000 mg | Freq: Once | INTRAMUSCULAR | Status: AC | PRN
  Administered 2014-09-20: 4 mg via INTRAVENOUS
  Filled 2014-09-20: qty 2

## 2014-09-20 MED ORDER — FENTANYL CITRATE 0.05 MG/ML IJ SOLN
25.0000 ug | INTRAMUSCULAR | Status: DC | PRN
  Administered 2014-09-20: 25 ug via INTRAVENOUS
  Administered 2014-09-20: 50 ug via INTRAVENOUS
  Filled 2014-09-20: qty 1

## 2014-09-20 MED ORDER — FENTANYL CITRATE 0.05 MG/ML IJ SOLN
INTRAMUSCULAR | Status: AC
  Filled 2014-09-20: qty 4

## 2014-09-20 MED ORDER — VASOPRESSIN 20 UNIT/ML IV SOLN
INTRAVENOUS | Status: DC | PRN
  Administered 2014-09-20: 20:00:00 via INTRAMUSCULAR

## 2014-09-20 MED ORDER — FENTANYL CITRATE 0.05 MG/ML IJ SOLN
INTRAMUSCULAR | Status: DC | PRN
  Administered 2014-09-20 (×3): 50 ug via INTRAVENOUS

## 2014-09-20 MED ORDER — KETOROLAC TROMETHAMINE 30 MG/ML IJ SOLN
INTRAMUSCULAR | Status: DC | PRN
  Administered 2014-09-20: 30 mg via INTRAVENOUS

## 2014-09-20 MED ORDER — PROPOFOL 10 MG/ML IV BOLUS
INTRAVENOUS | Status: DC | PRN
  Administered 2014-09-20: 200 mg via INTRAVENOUS

## 2014-09-20 MED ORDER — CEFAZOLIN SODIUM-DEXTROSE 2-3 GM-% IV SOLR
INTRAVENOUS | Status: AC
  Filled 2014-09-20: qty 50

## 2014-09-20 MED ORDER — GLYCINE 1.5 % IR SOLN
Status: DC | PRN
  Administered 2014-09-20: 3000 mL

## 2014-09-20 SURGICAL SUPPLY — 46 items
CANISTER SUCTION 2500CC (MISCELLANEOUS) ×3 IMPLANT
CANNULA CURETTE W/SYR 6 (CANNULA) IMPLANT
CANNULA CURETTE W/SYR 6MM (CANNULA)
CANNULA CURETTE W/SYR 7 (CANNULA) IMPLANT
CANNULA CURETTE W/SYR 7MM (CANNULA)
CATH ROBINSON RED A/P 16FR (CATHETERS) IMPLANT
CORD ACTIVE DISPOSABLE (ELECTRODE)
CORD ELECTRO ACTIVE DISP (ELECTRODE) IMPLANT
COVER TABLE BACK 60X90 (DRAPES) ×3 IMPLANT
DRAPE CAMERA CLOSED 9X96 (DRAPES) IMPLANT
DRAPE LG THREE QUARTER DISP (DRAPES) ×6 IMPLANT
ELECT BLADE 6.5 .24CM SHAFT (ELECTRODE) ×3 IMPLANT
ELECT LOOP GYNE PRO 24FR (CUTTING LOOP)
ELECT REM PT RETURN 9FT ADLT (ELECTROSURGICAL) ×3
ELECT VAPORTRODE GRVD BAR (ELECTRODE) IMPLANT
ELECTRODE KNIFE SHAPED 22FR (ELECTROSURGICAL) IMPLANT
ELECTRODE LOOP GYNE PRO 24FR (CUTTING LOOP) IMPLANT
ELECTRODE REM PT RTRN 9FT ADLT (ELECTROSURGICAL) ×1 IMPLANT
GAUZE SPONGE 4X4 16PLY XRAY LF (GAUZE/BANDAGES/DRESSINGS) ×3 IMPLANT
GLOVE BIO SURGEON STRL SZ8 (GLOVE) ×3 IMPLANT
GLOVE BIOGEL PI IND STRL 8.5 (GLOVE) ×1 IMPLANT
GLOVE BIOGEL PI INDICATOR 8.5 (GLOVE) ×2
GLOVE INDICATOR 7.5 STRL GRN (GLOVE) ×3 IMPLANT
GLOVE SURG SS PI 7.5 STRL IVOR (GLOVE) ×3 IMPLANT
GOWN STRL REIN XL XLG (GOWN DISPOSABLE) ×3 IMPLANT
GOWN STRL REUS W/ TWL XL LVL3 (GOWN DISPOSABLE) ×1 IMPLANT
GOWN STRL REUS W/TWL XL LVL3 (GOWN DISPOSABLE) ×2
LEGGING LITHOTOMY PAIR STRL (DRAPES) ×3 IMPLANT
LOOP ANGLED CUTTING 22FR (CUTTING LOOP) IMPLANT
PACK BASIN DAY SURGERY FS (CUSTOM PROCEDURE TRAY) ×3 IMPLANT
PAD OB MATERNITY 4.3X12.25 (PERSONAL CARE ITEMS) ×3 IMPLANT
PENCIL BUTTON HOLSTER BLD 10FT (ELECTRODE) ×3 IMPLANT
SET IRRIG Y TYPE TUR BLADDER L (SET/KITS/TRAYS/PACK) IMPLANT
SET TUBING HYSTEROSCOPY 2 NDL (TUBING) ×3 IMPLANT
STENT BALLN UTERINE 3CM 6FR (Stent) IMPLANT
STENT BALLN UTERINE 4CM 6FR (STENTS) IMPLANT
SUT SILK 2 0 SH (SUTURE) IMPLANT
SUT SILK 3 0 PS 1 (SUTURE) IMPLANT
SUT VIC AB 3-0 SH 27 (SUTURE) ×2
SUT VIC AB 3-0 SH 27X BRD (SUTURE) ×1 IMPLANT
SYR 20CC LL (SYRINGE) IMPLANT
SYR 3ML 18GX1 1/2 (SYRINGE) IMPLANT
TOWEL OR 17X24 6PK STRL BLUE (TOWEL DISPOSABLE) ×6 IMPLANT
TRAY DSU PREP LF (CUSTOM PROCEDURE TRAY) ×3 IMPLANT
TUBE HYSTEROSCOPY W Y-CONNECT (TUBING) ×3 IMPLANT
WATER STERILE IRR 500ML POUR (IV SOLUTION) ×3 IMPLANT

## 2014-09-20 NOTE — Op Note (Signed)
OPERATIVE NOTE  Preoperative diagnosis: Partial uterine septum, recurrent miscarriage  Postoperative diagnosis: Partial uterine septum, recurrent miscarriage, vaginal endometriosis  Procedure: Hysteroscopy, incision of uterine septum, excision of posterior fornix vaginal endometriosis  Surgeon: Governor Specking  Anesthesia: General  Complications: None  Estimated blood loss: 50 mL  Specimen: Endometriotic nodule of the posterior vaginal fornix to pathology  Findings: On exam under anesthesia, external genitalia, Bartholin's, Skene's, and urethra were within normal limits. There was a 2 x 2 centimeter posterior fornix nodule with bluish hue underneath the mucosa at 6:00 position behind the cervix. The cervix was grossly normal. The uterine corpus was normal sized retroflexed and mobile. A left ovarian 4 cm mass was palpable. There were no right adnexal masses. A rectovaginal exam confirmed normal rectal mucosa and non-involvement of the rectal wall with the vaginal fornix nodule.  Endocervical canal appeared normal.  Endometrium was thin, as the patient had been on Femara and Aygestin 2.5 mg twice a day. There was a partial uterine septum about 2 cm long with a wide base. On incision of the septum high vascularity was encountered. Both tubal ostia were seen.  Description of procedure: Patient was placed in dorsal supine position. General anesthesia was administered. She was placed in lithotomy position. She was prepped and draped in sterile manner. A vaginal speculum was placed. The posterior vaginal fornix nodule was grasped with Allis clamps and circumferentially cut with a Bovie and a discoid excision was done, making sure that the rectal wall is not injured. The hemostasis was achieved with superficial application of the Bovie in the crater. Stitches were not placed on the defect because it probably represents some migration of endometriosis which is present in the posterior cul-de-sac.  Repeat rectovaginal exam confirmed integrity of the rectal wall.  A dilute vasopressin solution containing 0.33 units per milliliter was injected into the cervical stroma x5 cc. A Slimline hysteroscope with 30 lens was inserted into the canal and above findings were noted. Distention medium was 1.5% glycine. Distention method was a hysteroscopic pump set at 80 mm mercury. Above findings were noted.  Using hysteroscopic scissors, the longitudinal septum of the uterus was incised up to the level of a line joining the 2 tubal ostia. Good hemostasis was ensured. Fluid deficit was 700 mL during the procedure, therefore we switched to normal saline solution as distention medium. Instrument count was correct. Estimated blood loss was less than 50 mL. The patient tolerated the procedure well and was transferred to recovery in satisfactory condition. She will stop norethindrone acetate and continue Femara. I will prescribe her Estrace 3 mg twice a day for 30 days, to the last 22 days old which medroxyprogesterone acetate 10 mg daily will be added.  The patient can deliver vaginally after such a procedure.  Governor Specking

## 2014-09-20 NOTE — Discharge Instructions (Signed)
° °  Home care Instructions:   Personal hygiene:  Used sanitary napkins for vaginal drainage not tampons. Shower or tub bathe the day after your procedure. No douching until bleeding stops. Always wipe from front to back after  Elimination.  Activity: Do not drive or operate any equipment today. The effects of the anesthesia are still present and drowsiness may result. Rest today, not necessarily flat bed rest, just take it easy. You may resume your normal activity in one to 2 days.  Sexual activity: No intercourse for one week or as indicated by your physician  Diet: Eat a light diet as desired this evening. You may resume a regular diet tomorrow.  Return to work: One to 2 days.  General Expectations of your surgery: Vaginal bleeding should be no heavier than a normal period. Spotting may continue up to 10 days. Mild cramps may continue for a couple of days. You may have a regular period in 2-6 weeks.  Unexpected observations call your doctor if these occur: persistent or heavy bleeding. Severe abdominal cramping or pain. Elevation of temperature greater than 100F.  Call for an appointment in one week.    Patient's Signature_______________________________________________________  Nurse's Signature________________________________________________________ Post Anesthesia Home Care Instructions  Activity: Get plenty of rest for the remainder of the day. A responsible adult should stay with you for 24 hours following the procedure.  For the next 24 hours, DO NOT: -Drive a car -Paediatric nurse -Drink alcoholic beverages -Take any medication unless instructed by your physician -Make any legal decisions or sign important papers.  Meals: Start with liquid foods such as gelatin or soup. Progress to regular foods as tolerated. Avoid greasy, spicy, heavy foods. If nausea and/or vomiting occur, drink only clear liquids until the nausea and/or vomiting subsides. Call your physician if  vomiting continues.  Special Instructions/Symptoms: Your throat may feel dry or sore from the anesthesia or the breathing tube placed in your throat during surgery. If this causes discomfort, gargle with warm salt water. The discomfort should disappear within 24 hours.

## 2014-09-20 NOTE — Anesthesia Preprocedure Evaluation (Addendum)
Anesthesia Evaluation  Patient identified by MRN, date of birth, ID band Patient awake    Reviewed: Allergy & Precautions, H&P , NPO status , Patient's Chart, lab work & pertinent test results  History of Anesthesia Complications (+) PONV and history of anesthetic complications  Airway Mallampati: II  TM Distance: >3 FB Neck ROM: Full    Dental no notable dental hx. (+) Teeth Intact, Dental Advisory Given   Pulmonary neg pulmonary ROS,  breath sounds clear to auscultation  Pulmonary exam normal       Cardiovascular negative cardio ROS  Rhythm:Regular Rate:Normal     Neuro/Psych  Headaches, negative psych ROS   GI/Hepatic Neg liver ROS, GERD-  Medicated and Controlled,  Endo/Other  negative endocrine ROS  Renal/GU negative Renal ROS  negative genitourinary   Musculoskeletal negative musculoskeletal ROS (+)   Abdominal   Peds negative pediatric ROS (+)  Hematology negative hematology ROS (+)   Anesthesia Other Findings   Reproductive/Obstetrics negative OB ROS                            Anesthesia Physical Anesthesia Plan  ASA: II  Anesthesia Plan: General   Post-op Pain Management:    Induction: Intravenous  Airway Management Planned:   Additional Equipment:   Intra-op Plan:   Post-operative Plan: Extubation in OR  Informed Consent: I have reviewed the patients History and Physical, chart, labs and discussed the procedure including the risks, benefits and alternatives for the proposed anesthesia with the patient or authorized representative who has indicated his/her understanding and acceptance.   Dental advisory given  Plan Discussed with: CRNA  Anesthesia Plan Comments:         Anesthesia Quick Evaluation

## 2014-09-20 NOTE — Anesthesia Postprocedure Evaluation (Signed)
Anesthesia Post Note  Patient: Victoria Maxwell  Procedure(s) Performed: Procedure(s) (LRB): HYSTEROSCOPY INCISION OF UTERINE SEPTUM, EXCISION OF POSTERIOR VAGINAL ENDOMETREOSIS  (N/A)  Anesthesia type: General  Patient location: PACU  Post pain: Pain level controlled  Post assessment: Post-op Vital signs reviewed  Last Vitals: BP 114/64  Pulse 65  Temp(Src) 36.4 C (Oral)  Resp 16  Ht 6\' 1"  (1.854 m)  Wt 171 lb (77.565 kg)  BMI 22.57 kg/m2  SpO2 100%  LMP 08/31/2014  Breastfeeding? No  Post vital signs: Reviewed  Level of consciousness: sedated  Complications: No apparent anesthesia complications

## 2014-09-20 NOTE — H&P (Addendum)
Victoria Maxwell is a 33 y.o. female , originally referred to me by Dr. Marylynn Maxwell, for recurrent pregnancy loss and uterine septum. Transvaginal ultrasound and a CT scan showed a wide base partial uterine septum. Patient underwent additional workup including chromosome analysis of both partners, all of which were within normal limits. The patient is also known to have a left ovarian endometrioma. After discussion of the pros and cons of intervening with her relatively asymptomatic pelvic endometriosis, patient opted not to add laparoscopy to her procedure.  Pertinent Gynecological History: Menses: regular Bleeding: normal Contraception: none DES exposure: denies Blood transfusions: none Sexually transmitted diseases: no past history Previous GYN Procedures: D&C; Laparoscopy  Last mammogram: normal Last pap: normal  OB History: G2 A2   Menstrual History: Menarche age: 13 No LMP recorded.    Past Medical History  Diagnosis Date  . PONV (postoperative nausea and vomiting)   . Headache(784.0)   . Endometriosis   . History of ovarian cyst   . Uterus disorder     SEPTUM  . GERD (gastroesophageal reflux disease)   . Wears glasses                     Past Surgical History  Procedure Laterality Date  . Wisdom tooth extraction    . Dilation and evacuation N/A 11/27/2013    Procedure: DILATATION AND EVACUATION;  Surgeon: Victoria Pearson, MD;  Location: [facility];  Service: Gynecology;  Laterality: N/A;  . Laparoscopy/ drainage and partial cystectomy left endometrioma/  fulgeration endometriosis  07-25-2009             Family History  Problem Relation Age of Onset  . Anemia Mother   . Hypothyroidism Mother   . Cancer Maternal Grandmother    No hereditary disease.  No cancer of breast, ovary, uterus. No cutaneous leiomyomatosis or renal cell carcinoma.  History   Social History  . Marital Status: Married    Spouse Name: N/A    Number of Children: N/A  . Years of  Education: N/A   Occupational History  . Not on file.   Social History Main Topics  . Smoking status: Never Smoker   . Smokeless tobacco: Never Used  . Alcohol Use: Yes     Comment: not with preg  . Drug Use: No  . Sexual Activity: Not on file   Other Topics Concern  . Not on file   Social History Narrative  . No narrative on file    No Known Allergies  No current facility-administered medications on file prior to encounter.   No current outpatient prescriptions on file prior to encounter.     Review of Systems  Constitutional: Negative.   HENT: Negative.   Eyes: Negative.   Respiratory: Negative.   Cardiovascular: Negative.   Gastrointestinal: Negative.   Genitourinary: Negative.   Musculoskeletal: Negative.   Skin: Negative.   Neurological: Negative.   Endo/Heme/Allergies: Negative.   Psychiatric/Behavioral: Negative.      Physical Exam  Ht 6\' 1"  (1.854 m)  Wt 74.844 kg (165 lb)  BMI 21.77 kg/m2  LMP 08/31/2014  Breastfeeding? No Constitutional: She is oriented to person, place, and time. She appears well-developed and well-nourished.  HENT:  Head: Normocephalic and atraumatic.  Nose: Nose normal.  Mouth/Throat: Oropharynx is clear and moist. No oropharyngeal exudate.  Eyes: Conjunctivae normal and EOM are normal. Pupils are equal, round, and reactive to light. No scleral icterus.  Neck: Normal range of motion. Neck supple. No  tracheal deviation present. No thyromegaly present.  Cardiovascular: Normal rate.   Respiratory: Effort normal and breath sounds normal.  GI: Soft. Bowel sounds are normal. She exhibits no distension and no mass. There is no tenderness.  Lymphadenopathy:    She has no cervical adenopathy.  Neurological: She is alert and oriented to person, place, and time. She has normal reflexes.  Skin: Skin is warm.  Psychiatric: She has a normal mood and affect. Her behavior is normal. Judgment and thought content normal.        Assessment/Plan:  Pt will have excision of 1.8-2cm uterine septum Benefits and risks of surgery were discussed with the patient and her family member again. All of patient's questions were answered.  She verbalized understanding.     Governor Specking

## 2014-09-20 NOTE — Transfer of Care (Signed)
Immediate Anesthesia Transfer of Care Note  Patient: Victoria Maxwell  Procedure(s) Performed: Procedure(s): HYSTEROSCOPY INCISION OF UTERINE SEPTUM, EXCISION OF POSTERIOR VAGINAL ENDOMETREOSIS  (N/A)  Patient Location: PACU  Anesthesia Type:General  Level of Consciousness: awake, alert  and oriented  Airway & Oxygen Therapy: Patient Spontanous Breathing and Patient connected to nasal cannula oxygen  Post-op Assessment: Report given to PACU RN  Post vital signs: Reviewed and stable  Complications: No apparent anesthesia complications

## 2014-09-20 NOTE — Anesthesia Procedure Notes (Signed)
Procedure Name: LMA Insertion Date/Time: 09/20/2014 7:37 PM Performed by: Bethena Roys T Pre-anesthesia Checklist: Patient identified, Emergency Drugs available, Suction available and Patient being monitored Patient Re-evaluated:Patient Re-evaluated prior to inductionOxygen Delivery Method: Circle System Utilized Preoxygenation: Pre-oxygenation with 100% oxygen Intubation Type: IV induction Ventilation: Mask ventilation without difficulty LMA: LMA inserted LMA Size: 5.0 Number of attempts: 1 Airway Equipment and Method: bite block Placement Confirmation: positive ETCO2 Tube secured with: Tape Dental Injury: Teeth and Oropharynx as per pre-operative assessment

## 2014-09-21 ENCOUNTER — Encounter (HOSPITAL_BASED_OUTPATIENT_CLINIC_OR_DEPARTMENT_OTHER): Payer: Self-pay | Admitting: Obstetrics and Gynecology

## 2014-09-24 ENCOUNTER — Encounter (HOSPITAL_BASED_OUTPATIENT_CLINIC_OR_DEPARTMENT_OTHER): Payer: Self-pay | Admitting: Obstetrics and Gynecology

## 2015-06-18 LAB — OB RESULTS CONSOLE GC/CHLAMYDIA
CHLAMYDIA, DNA PROBE: NEGATIVE
Gonorrhea: NEGATIVE

## 2015-06-18 LAB — OB RESULTS CONSOLE ANTIBODY SCREEN: ANTIBODY SCREEN: NEGATIVE

## 2015-06-18 LAB — OB RESULTS CONSOLE ABO/RH: RH Type: POSITIVE

## 2015-06-18 LAB — OB RESULTS CONSOLE RPR: RPR: NONREACTIVE

## 2015-06-18 LAB — OB RESULTS CONSOLE HIV ANTIBODY (ROUTINE TESTING): HIV: NONREACTIVE

## 2015-06-18 LAB — OB RESULTS CONSOLE RUBELLA ANTIBODY, IGM: Rubella: IMMUNE

## 2015-06-18 LAB — OB RESULTS CONSOLE HEPATITIS B SURFACE ANTIGEN: Hepatitis B Surface Ag: NEGATIVE

## 2015-06-26 ENCOUNTER — Other Ambulatory Visit: Payer: Self-pay | Admitting: Obstetrics and Gynecology

## 2015-06-27 LAB — CYTOLOGY - PAP

## 2015-08-14 IMAGING — US US OB TRANSVAGINAL
1 series · 13 of 13 positions shown · non-contrast
Comparison: 04/05/2009

CLINICAL DATA: Incomplete abortion

EXAM:
TRANSVAGINAL OB ULTRASOUND
TECHNIQUE: Transvaginal ultrasound was performed for complete evaluation of the
gestation as well as the maternal uterus, adnexal regions, and
pelvic cul-de-sac.

[Series 1: us ob transvaginal · 13 of 13 slices shown]
[im 1/13]
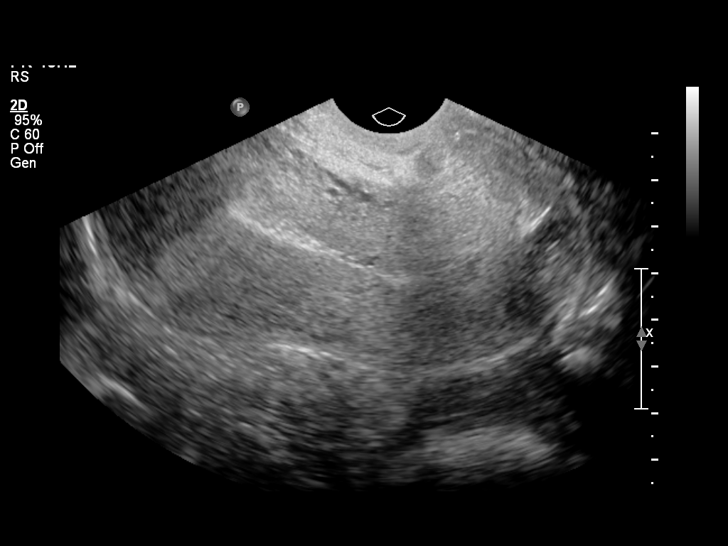
[im 2/13]
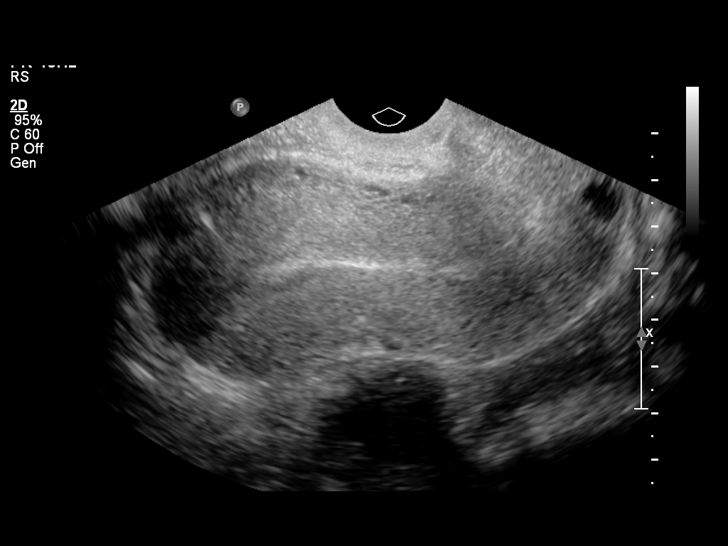
[im 3/13]
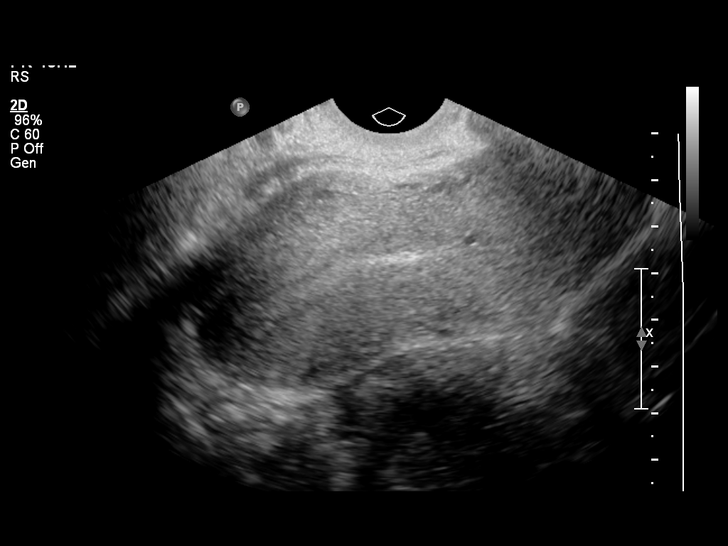
[im 4/13]
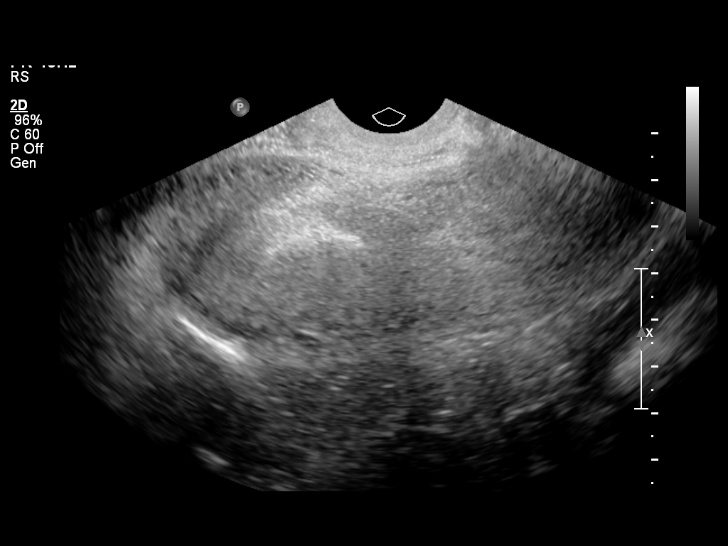
[im 5/13]
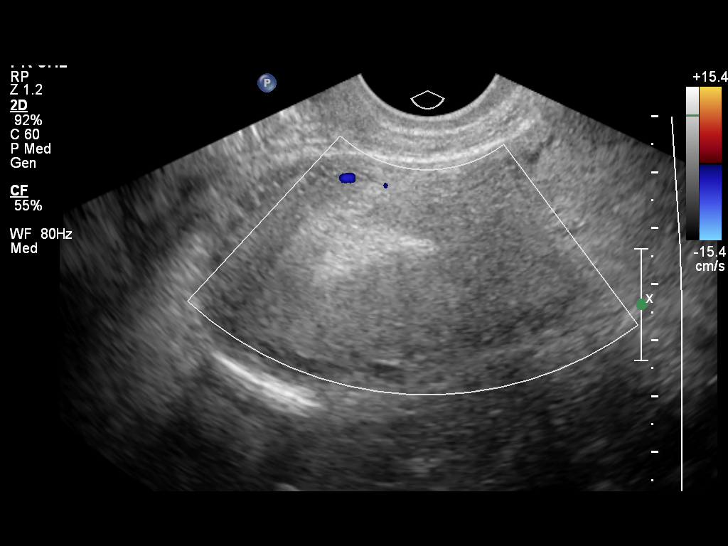
[im 6/13]
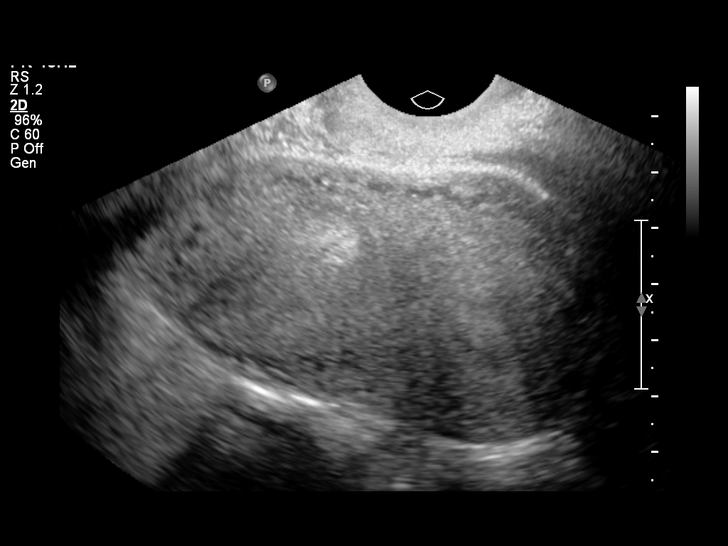
[im 7/13]
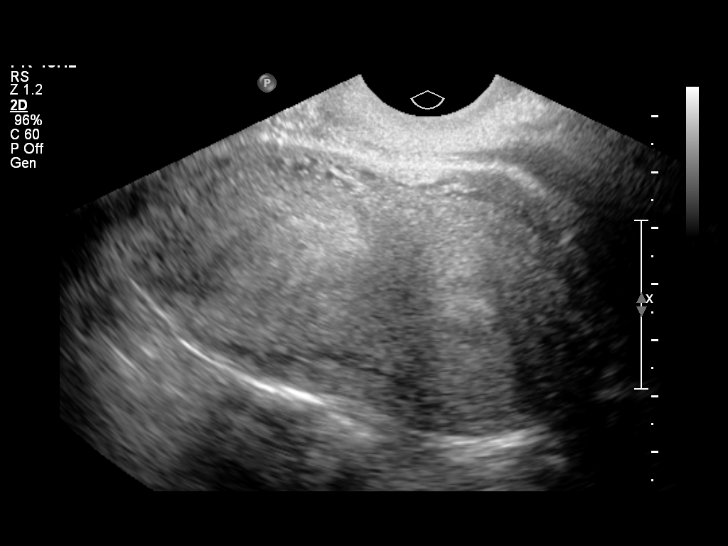
[im 8/13]
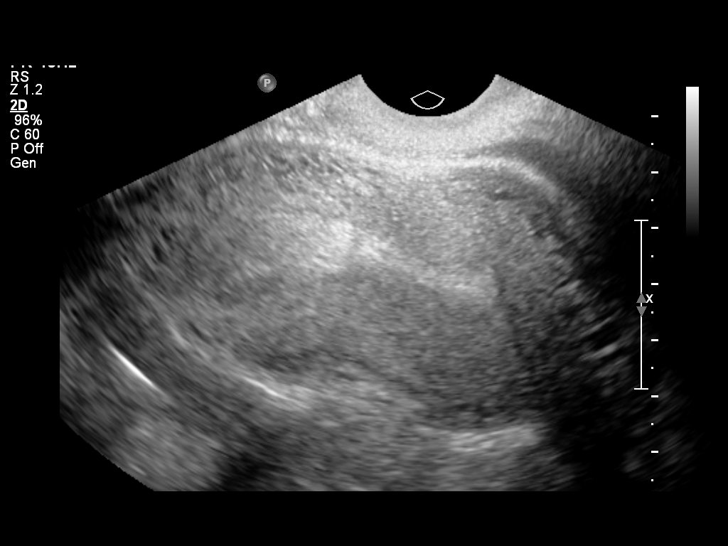
[im 9/13]
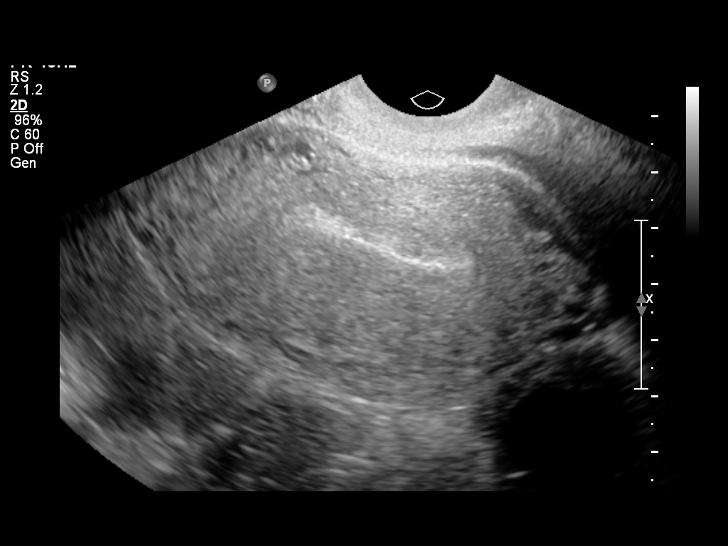
[im 10/13]
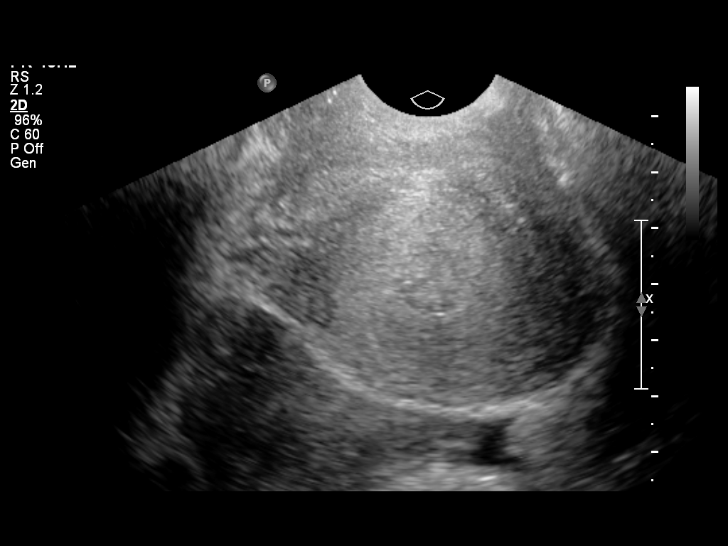
[im 11/13]
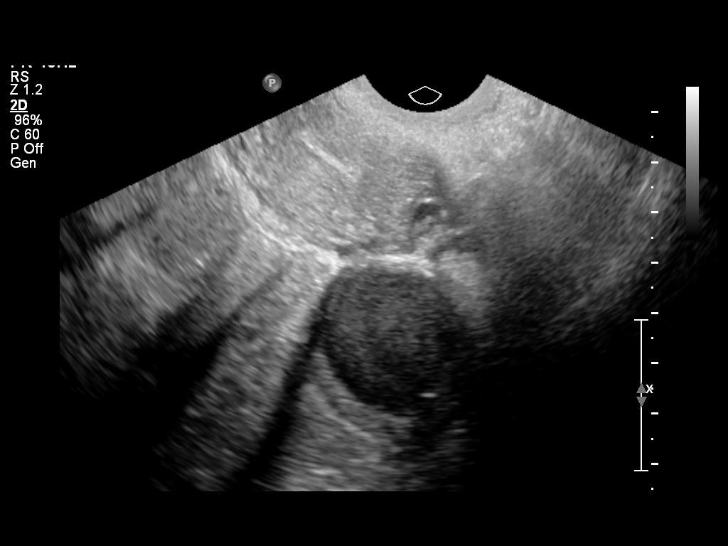
[im 12/13]
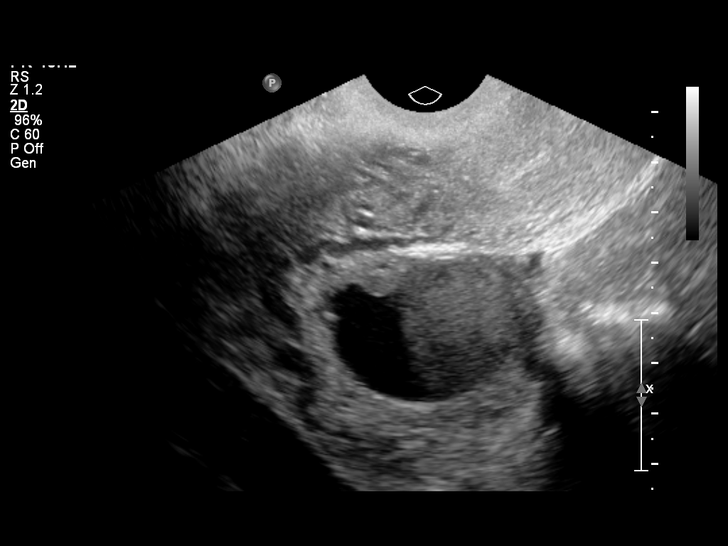
[im 13/13]
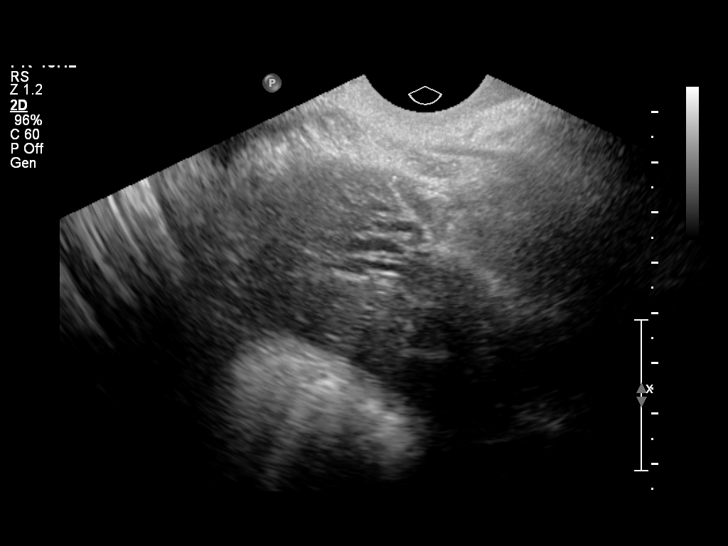

[13 of 13 positions shown; findings below may reference images not displayed]

FINDINGS: Dysmorphic uterus, with divergent horns which could be septate or
subseptate. The outer fundic contour appears smooth on previous CT.
The endometrium measures up to 7 mm in thickness. No intrauterine
gestational sac seen.

Right ovary was not visualized.

Left ovary not measured it in its entirety. There is a likely cystic
intra-ovarian mass, measuring 3.7 x 2.7 x 2.8 cm. The lesion is
partially hypoechoic and partially isoechoic. Color Doppler not
applied. Isoechoic portion has flat contours. No shadowing.
IMPRESSION: 1. In this patient with reported missed abortion, no intrauterine
gestation seen. Endometrial thickness measures up to 7 mm. Please
correlate with quantitative beta HCG trend.
2. Septated or subseptate uterus.
3. Complex cyst in the left ovary, 3.8 cm. Since a complex cyst was
seen the left ovary in 9282, short-interval follow up ultrasound in
6-12 weeks is recommended, preferably during the week following the
patient's normal menses.

## 2016-01-03 ENCOUNTER — Encounter (HOSPITAL_COMMUNITY): Payer: Self-pay | Admitting: *Deleted

## 2016-01-03 ENCOUNTER — Telehealth (HOSPITAL_COMMUNITY): Payer: Self-pay | Admitting: *Deleted

## 2016-01-03 LAB — OB RESULTS CONSOLE GBS: STREP GROUP B AG: NEGATIVE

## 2016-01-03 NOTE — Telephone Encounter (Signed)
Preadmission screen  

## 2016-01-07 ENCOUNTER — Inpatient Hospital Stay (HOSPITAL_COMMUNITY): Admission: AD | Admit: 2016-01-07 | Payer: Self-pay | Source: Ambulatory Visit | Admitting: Obstetrics and Gynecology

## 2016-01-14 ENCOUNTER — Inpatient Hospital Stay (HOSPITAL_COMMUNITY): Payer: 59 | Admitting: Anesthesiology

## 2016-01-14 ENCOUNTER — Encounter (HOSPITAL_COMMUNITY): Payer: Self-pay

## 2016-01-14 ENCOUNTER — Inpatient Hospital Stay (HOSPITAL_COMMUNITY)
Admission: RE | Admit: 2016-01-14 | Discharge: 2016-01-18 | DRG: 766 | Disposition: A | Discharge status: Home / Self Care | Payer: 59 | Source: Ambulatory Visit | Attending: Obstetrics and Gynecology | Admitting: Obstetrics and Gynecology

## 2016-01-14 DIAGNOSIS — Z3A41 41 weeks gestation of pregnancy: Secondary | ICD-10-CM

## 2016-01-14 DIAGNOSIS — O324XX0 Maternal care for high head at term, not applicable or unspecified: Secondary | ICD-10-CM | POA: Diagnosis not present

## 2016-01-14 DIAGNOSIS — O480 Post-term pregnancy: Principal | ICD-10-CM | POA: Diagnosis present

## 2016-01-14 DIAGNOSIS — Z3490 Encounter for supervision of normal pregnancy, unspecified, unspecified trimester: Secondary | ICD-10-CM

## 2016-01-14 LAB — CBC
HEMATOCRIT: 35.3 % — AB (ref 36.0–46.0)
HEMOGLOBIN: 12.2 g/dL (ref 12.0–15.0)
MCH: 30.5 pg (ref 26.0–34.0)
MCHC: 34.6 g/dL (ref 30.0–36.0)
MCV: 88.3 fL (ref 78.0–100.0)
PLATELETS: 168 10*3/uL (ref 150–400)
RBC: 4 MIL/uL (ref 3.87–5.11)
RDW: 13.9 % (ref 11.5–15.5)
WBC: 11.6 10*3/uL — AB (ref 4.0–10.5)

## 2016-01-14 LAB — RAPID HIV SCREEN (HIV 1/2 AB+AG)
HIV 1/2 ANTIBODIES: NONREACTIVE
HIV-1 P24 ANTIGEN - HIV24: NONREACTIVE

## 2016-01-14 LAB — TYPE AND SCREEN
ABO/RH(D): O POS
ANTIBODY SCREEN: NEGATIVE

## 2016-01-14 MED ORDER — LIDOCAINE HCL (PF) 1 % IJ SOLN: 30.0000 mL | INTRAMUSCULAR | Status: DC | PRN

## 2016-01-14 MED ORDER — EPHEDRINE 5 MG/ML INJ: 10.0000 mg | INTRAVENOUS | Status: DC | PRN

## 2016-01-14 MED ORDER — ACETAMINOPHEN 325 MG PO TABS: 650.0000 mg | ORAL_TABLET | ORAL | Status: DC | PRN

## 2016-01-14 MED ORDER — OXYCODONE-ACETAMINOPHEN 5-325 MG PO TABS
1.0000 | ORAL_TABLET | ORAL | Status: DC | PRN
Start: 2016-01-14 — End: 2016-01-15

## 2016-01-14 MED ORDER — OXYCODONE-ACETAMINOPHEN 5-325 MG PO TABS: 2.0000 | ORAL_TABLET | ORAL | Status: DC | PRN

## 2016-01-14 MED ORDER — LACTATED RINGERS IV SOLN
500.0000 mL | INTRAVENOUS | Status: DC | PRN
  Administered 2016-01-14: 500 mL via INTRAVENOUS
  Administered 2016-01-14: 300 mL via INTRAVENOUS

## 2016-01-14 MED ORDER — ONDANSETRON HCL 4 MG/2ML IJ SOLN
4.0000 mg | Freq: Four times a day (QID) | INTRAMUSCULAR | Status: DC | PRN
  Administered 2016-01-14: 4 mg via INTRAVENOUS
  Filled 2016-01-14: qty 2

## 2016-01-14 MED ORDER — LACTATED RINGERS IV SOLN
INTRAVENOUS | Status: DC
  Administered 2016-01-14 (×4): via INTRAVENOUS

## 2016-01-14 MED ORDER — LACTATED RINGERS IV SOLN
500.0000 mL | Freq: Once | INTRAVENOUS | Status: AC
  Administered 2016-01-14: 500 mL via INTRAVENOUS

## 2016-01-14 MED ORDER — DIPHENHYDRAMINE HCL 50 MG/ML IJ SOLN: 12.5000 mg | INTRAMUSCULAR | Status: DC | PRN

## 2016-01-14 MED ORDER — TERBUTALINE SULFATE 1 MG/ML IJ SOLN
0.2500 mg | Freq: Once | INTRAMUSCULAR | Status: AC | PRN
  Administered 2016-01-14: 0.25 mg via SUBCUTANEOUS

## 2016-01-14 MED ORDER — MISOPROSTOL 25 MCG QUARTER TABLET
25.0000 ug | ORAL_TABLET | ORAL | Status: DC | PRN
  Administered 2016-01-14 (×3): 25 ug via VAGINAL
  Filled 2016-01-14 (×3): qty 0.25

## 2016-01-14 MED ORDER — FLEET ENEMA 7-19 GM/118ML RE ENEM
1.0000 | ENEMA | RECTAL | Status: DC | PRN
Start: 2016-01-14 — End: 2016-01-15

## 2016-01-14 MED ORDER — CITRIC ACID-SODIUM CITRATE 334-500 MG/5ML PO SOLN
30.0000 mL | ORAL | Status: DC | PRN
  Administered 2016-01-15: 30 mL via ORAL
  Filled 2016-01-14: qty 15

## 2016-01-14 MED ORDER — FENTANYL 2.5 MCG/ML BUPIVACAINE 1/10 % EPIDURAL INFUSION (WH - ANES)
14.0000 mL/h | INTRAMUSCULAR | Status: DC | PRN
  Administered 2016-01-14 – 2016-01-15 (×2): 14 mL/h via EPIDURAL
  Filled 2016-01-14 (×2): qty 125

## 2016-01-14 MED ORDER — PHENYLEPHRINE 40 MCG/ML (10ML) SYRINGE FOR IV PUSH (FOR BLOOD PRESSURE SUPPORT): 80.0000 ug | PREFILLED_SYRINGE | INTRAVENOUS | Status: DC | PRN

## 2016-01-14 MED ORDER — OXYTOCIN 10 UNIT/ML IJ SOLN
2.5000 [IU]/h | INTRAVENOUS | Status: DC
  Filled 2016-01-14: qty 4

## 2016-01-14 MED ORDER — TERBUTALINE SULFATE 1 MG/ML IJ SOLN
0.2500 mg | Freq: Once | INTRAMUSCULAR | Status: DC | PRN
  Filled 2016-01-14: qty 1

## 2016-01-14 MED ORDER — LACTATED RINGERS IV SOLN
INTRAVENOUS | Status: DC
  Administered 2016-01-14 – 2016-01-15 (×4): via INTRAUTERINE

## 2016-01-14 MED ORDER — OXYTOCIN BOLUS FROM INFUSION: 500.0000 mL | INTRAVENOUS | Status: DC

## 2016-01-14 MED ORDER — OXYTOCIN 10 UNIT/ML IJ SOLN
1.0000 m[IU]/min | INTRAMUSCULAR | Status: DC
  Administered 2016-01-14: 2 m[IU]/min via INTRAVENOUS

## 2016-01-14 MED ORDER — LIDOCAINE HCL (PF) 1 % IJ SOLN
INTRAMUSCULAR | Status: DC | PRN
  Administered 2016-01-14 (×2): 4 mL

## 2016-01-14 MED ORDER — PHENYLEPHRINE 40 MCG/ML (10ML) SYRINGE FOR IV PUSH (FOR BLOOD PRESSURE SUPPORT)
80.0000 ug | PREFILLED_SYRINGE | INTRAVENOUS | Status: DC | PRN
  Filled 2016-01-14: qty 20

## 2016-01-14 NOTE — H&P (Signed)
Victoria Maxwell  DICTATION # [phone] CSN# [id number]   Margarette Asal, MD 01/14/2016 7:30 AM

## 2016-01-14 NOTE — Anesthesia Preprocedure Evaluation (Signed)
Anesthesia Evaluation  Patient identified by MRN, date of birth, ID band Patient awake    Reviewed: Allergy & Precautions, H&P , NPO status , Patient's Chart, lab work & pertinent test results  History of Anesthesia Complications (+) PONV and history of anesthetic complications  Airway Mallampati: II  TM Distance: >3 FB Neck ROM: full    Dental no notable dental hx.    Pulmonary neg pulmonary ROS,    breath sounds clear to auscultation       Cardiovascular negative cardio ROS   Rhythm:regular Rate:Normal     Neuro/Psych  Headaches, negative psych ROS   GI/Hepatic Neg liver ROS, GERD  ,  Endo/Other  negative endocrine ROS  Renal/GU negative Renal ROS     Musculoskeletal   Abdominal   Peds  Hematology   Anesthesia Other Findings   Reproductive/Obstetrics (+) Pregnancy                             Anesthesia Physical Anesthesia Plan  ASA: II  Anesthesia Plan: Epidural   Post-op Pain Management:    Induction:   Airway Management Planned:   Additional Equipment:   Intra-op Plan:   Post-operative Plan:   Informed Consent: I have reviewed the patients History and Physical, chart, labs and discussed the procedure including the risks, benefits and alternatives for the proposed anesthesia with the patient or authorized representative who has indicated his/her understanding and acceptance.   Dental Advisory Given  Plan Discussed with: Anesthesiologist  Anesthesia Plan Comments:         Anesthesia Quick Evaluation

## 2016-01-14 NOTE — Anesthesia Procedure Notes (Signed)
Epidural Patient location during procedure: OB Start time: 01/14/2016 4:48 PM End time: 01/14/2016 4:59 PM  Staffing Anesthesiologist: Duane Boston Performed by: anesthesiologist   Preanesthetic Checklist Completed: patient identified, site marked, pre-op evaluation, timeout performed, IV checked, risks and benefits discussed and monitors and equipment checked  Epidural Patient position: sitting Prep: DuraPrep Patient monitoring: heart rate, cardiac monitor, continuous pulse ox and blood pressure Approach: midline Location: L2-L3 Injection technique: LOR saline  Needle:  Needle type: Tuohy  Needle gauge: 17 G Needle length: 9 cm Needle insertion depth: 7 cm Catheter size: 20 Guage Catheter at skin depth: 11 cm Test dose: negative and Other  Assessment Events: blood not aspirated, injection not painful, no injection resistance and negative IV test  Additional Notes Informed consent obtained prior to proceeding including risk of failure, 1% risk of PDPH, risk of minor discomfort and bruising.  Discussed rare but serious complications including epidural abscess, permanent nerve injury, epidural hematoma.  Discussed alternatives to epidural analgesia and patient desires to proceed.  Timeout performed pre-procedure verifying patient name, procedure, and platelet count.  Patient tolerated procedure well.

## 2016-01-14 NOTE — Progress Notes (Signed)
Closed/ post after 2 doses Cytotec, next @ 1030

## 2016-01-14 NOTE — Consults (Signed)
  Anesthesia Pain Consult Note  Patient: Victoria Maxwell, 35 y.o., female  Consult Requested by: Molli Posey, MD  Reason for Consult: CRNA rounds  Level of Consciousness: alert  Pain: 0 /10  Pain goal with natural childbirth =7.

## 2016-01-14 NOTE — Progress Notes (Signed)
6/C/-1, ISE + IUPC, + scalp stim w/ ctx q 2-6, +BTB, will start 1x1 pit per protocol and obsv for labor

## 2016-01-14 NOTE — H&P (Signed)
Victoria Maxwell, Victoria NO.:  [id number]  MEDICAL RECORD NO.:  [id number]  LOCATION:                                 FACILITY:  PHYSICIAN:  Ralene Bathe. Matthew Saras, M.D.DATE OF BIRTH:  1981-05-24  DATE OF ADMISSION:  01/14/2016 DATE OF DISCHARGE:                             HISTORY & PHYSICAL   +  CHIEF COMPLAINT:  For labor induction at term.  HISTORY OF PRESENT ILLNESS:  A 35 year old, G3, P-0-0-2-0, EDD January 07, 2016, presents for labor induction at 41 weeks.  Last seen on January 08, 2016.  At that time was vertex BPP 8/8, EFW 8 pounds 8 ounces, cervix that was closed and soft, GBS was negative, presents now for two-stage labor induction, the protocol was reviewed with her which she understands and accepts.  PAST MEDICAL HISTORY:  Blood type is O positive.  Please see the [place] form for her social and family history.  She has had a division of the uterine septum by Dr. Kerin Perna to help her to carry this pregnancy.  PHYSICAL EXAMINATION:  VITAL SIGNS:  Temp 98.2, blood pressure 118/68. HEENT:  Unremarkable. NECK:  Supple without masses. LUNGS:  Clear. CARDIOVASCULAR:  Regular rate and rhythm without murmurs, rubs, or gallops. BREASTS:  Not examined. PELVIC:  Term fundal height.  Fetal heart rate 140.  Cervix was closed and soft, vertex. EXTREMITIES:  Unremarkable. NEUROLOGIC:  Unremarkable.  IMPRESSION:  A 41-week gestation.  PLAN:  Two-stage labor induction.  Procedure and risks reviewed.     Victoria Maxwell M. Matthew Saras, M.D.    [facility]/MEDQ  D:  01/13/2016  T:  01/13/2016  Job:  [id number]

## 2016-01-14 NOTE — Progress Notes (Signed)
CTSP earlier with ctx too freq, pit was d/c'd, received sq terb plus O2, ISE and IUPC placed   Now, 5 cm, ctx q 3-4 w some variable decels w/ recovery, will obsv and start amnioinfusion per protocol

## 2016-01-15 ENCOUNTER — Encounter (HOSPITAL_COMMUNITY)
Admission: RE | Disposition: A | Discharge status: Home / Self Care | Payer: Self-pay | Source: Ambulatory Visit | Attending: Obstetrics and Gynecology

## 2016-01-15 ENCOUNTER — Encounter (HOSPITAL_COMMUNITY): Payer: Self-pay

## 2016-01-15 LAB — RPR: RPR Ser Ql: NONREACTIVE

## 2016-01-15 SURGERY — Surgical Case
Anesthesia: Epidural

## 2016-01-15 MED ORDER — ONDANSETRON HCL 4 MG/2ML IJ SOLN
INTRAMUSCULAR | Status: DC | PRN
  Administered 2016-01-15: 4 mg via INTRAVENOUS

## 2016-01-15 MED ORDER — CEFAZOLIN SODIUM-DEXTROSE 2-3 GM-% IV SOLR
INTRAVENOUS | Status: DC | PRN
  Administered 2016-01-15: 2 g via INTRAVENOUS

## 2016-01-15 MED ORDER — ONDANSETRON HCL 4 MG/2ML IJ SOLN
INTRAMUSCULAR | Status: AC
  Filled 2016-01-15: qty 2

## 2016-01-15 MED ORDER — BISACODYL 10 MG RE SUPP: 10.0000 mg | Freq: Every day | RECTAL | Status: DC | PRN

## 2016-01-15 MED ORDER — SODIUM CHLORIDE 0.9 % IR SOLN
Status: DC | PRN
Start: 2016-01-15 — End: 2016-01-15
  Administered 2016-01-15: 1000 mL

## 2016-01-15 MED ORDER — OXYTOCIN 10 UNIT/ML IJ SOLN
40.0000 [IU] | INTRAVENOUS | Status: DC | PRN
  Administered 2016-01-15: 40 [IU] via INTRAVENOUS

## 2016-01-15 MED ORDER — LACTATED RINGERS IV SOLN
Freq: Once | INTRAVENOUS | Status: AC
  Administered 2016-01-15: 15:00:00 via INTRAVENOUS

## 2016-01-15 MED ORDER — NALBUPHINE HCL 10 MG/ML IJ SOLN: 5.0000 mg | INTRAMUSCULAR | Status: DC | PRN

## 2016-01-15 MED ORDER — FLEET ENEMA 7-19 GM/118ML RE ENEM: 1.0000 | ENEMA | Freq: Every day | RECTAL | Status: DC | PRN

## 2016-01-15 MED ORDER — PHENYLEPHRINE 40 MCG/ML (10ML) SYRINGE FOR IV PUSH (FOR BLOOD PRESSURE SUPPORT)
PREFILLED_SYRINGE | INTRAVENOUS | Status: AC
  Filled 2016-01-15: qty 10

## 2016-01-15 MED ORDER — DEXAMETHASONE SODIUM PHOSPHATE 4 MG/ML IJ SOLN
INTRAMUSCULAR | Status: AC
  Filled 2016-01-15: qty 1

## 2016-01-15 MED ORDER — MENTHOL 3 MG MT LOZG
1.0000 | LOZENGE | OROMUCOSAL | Status: DC | PRN
Start: 2016-01-15 — End: 2016-01-18

## 2016-01-15 MED ORDER — DIBUCAINE 1 % RE OINT
1.0000 | TOPICAL_OINTMENT | RECTAL | Status: DC | PRN
Start: 2016-01-15 — End: 2016-01-18

## 2016-01-15 MED ORDER — TETANUS-DIPHTH-ACELL PERTUSSIS 5-2.5-18.5 LF-MCG/0.5 IM SUSP: 0.5000 mL | Freq: Once | INTRAMUSCULAR | Status: DC

## 2016-01-15 MED ORDER — IBUPROFEN 800 MG PO TABS
800.0000 mg | ORAL_TABLET | Freq: Three times a day (TID) | ORAL | Status: DC | PRN
  Filled 2016-01-15: qty 1

## 2016-01-15 MED ORDER — PROMETHAZINE HCL 25 MG/ML IJ SOLN: 6.2500 mg | INTRAMUSCULAR | Status: DC | PRN

## 2016-01-15 MED ORDER — SODIUM CHLORIDE 0.9% FLUSH: 3.0000 mL | INTRAVENOUS | Status: DC | PRN

## 2016-01-15 MED ORDER — FENTANYL CITRATE (PF) 100 MCG/2ML IJ SOLN: 25.0000 ug | INTRAMUSCULAR | Status: DC | PRN

## 2016-01-15 MED ORDER — COMPLETENATE 29-1 MG PO CHEW
1.0000 | CHEWABLE_TABLET | Freq: Every day | ORAL | Status: DC
  Administered 2016-01-15 – 2016-01-17 (×2): 1 via ORAL
  Filled 2016-01-15 (×4): qty 1

## 2016-01-15 MED ORDER — SODIUM BICARBONATE 8.4 % IV SOLN
INTRAVENOUS | Status: AC
  Filled 2016-01-15: qty 50

## 2016-01-15 MED ORDER — ONDANSETRON HCL 4 MG/2ML IJ SOLN: 4.0000 mg | Freq: Three times a day (TID) | INTRAMUSCULAR | Status: DC | PRN

## 2016-01-15 MED ORDER — SODIUM CHLORIDE 0.9% FLUSH: 3.0000 mL | Freq: Two times a day (BID) | INTRAVENOUS | Status: DC

## 2016-01-15 MED ORDER — LANOLIN HYDROUS EX OINT: 1.0000 "application " | TOPICAL_OINTMENT | CUTANEOUS | Status: DC | PRN

## 2016-01-15 MED ORDER — NALOXONE HCL 0.4 MG/ML IJ SOLN: 0.4000 mg | INTRAMUSCULAR | Status: DC | PRN

## 2016-01-15 MED ORDER — OXYCODONE-ACETAMINOPHEN 5-325 MG PO TABS
1.0000 | ORAL_TABLET | ORAL | Status: DC | PRN
  Administered 2016-01-18: 1 via ORAL
  Filled 2016-01-15: qty 1

## 2016-01-15 MED ORDER — ACETAMINOPHEN 500 MG PO TABS: 1000.0000 mg | ORAL_TABLET | Freq: Four times a day (QID) | ORAL | Status: DC

## 2016-01-15 MED ORDER — MEPERIDINE HCL 25 MG/ML IJ SOLN
INTRAMUSCULAR | Status: AC
  Filled 2016-01-15: qty 1

## 2016-01-15 MED ORDER — IBUPROFEN 100 MG/5ML PO SUSP
800.0000 mg | Freq: Three times a day (TID) | ORAL | Status: DC | PRN
Start: 2016-01-15 — End: 2016-01-18
  Administered 2016-01-15 – 2016-01-18 (×6): 800 mg via ORAL
  Filled 2016-01-15 (×8): qty 40

## 2016-01-15 MED ORDER — LACTATED RINGERS IV SOLN
INTRAVENOUS | Status: DC | PRN
  Administered 2016-01-15 (×2): via INTRAVENOUS

## 2016-01-15 MED ORDER — CEFAZOLIN SODIUM-DEXTROSE 2-3 GM-% IV SOLR: 2.0000 g | Freq: Once | INTRAVENOUS | Status: DC

## 2016-01-15 MED ORDER — NALOXONE HCL 2 MG/2ML IJ SOSY
1.0000 ug/kg/h | PREFILLED_SYRINGE | INTRAVENOUS | Status: DC | PRN
  Filled 2016-01-15: qty 2

## 2016-01-15 MED ORDER — ACETAMINOPHEN 325 MG PO TABS
650.0000 mg | ORAL_TABLET | ORAL | Status: DC | PRN
  Administered 2016-01-15 – 2016-01-17 (×8): 650 mg via ORAL
  Filled 2016-01-15 (×8): qty 2

## 2016-01-15 MED ORDER — SIMETHICONE 80 MG PO CHEW
80.0000 mg | CHEWABLE_TABLET | ORAL | Status: DC
  Administered 2016-01-15 – 2016-01-18 (×3): 80 mg via ORAL
  Filled 2016-01-15 (×3): qty 1

## 2016-01-15 MED ORDER — KETOROLAC TROMETHAMINE 30 MG/ML IJ SOLN
30.0000 mg | Freq: Four times a day (QID) | INTRAMUSCULAR | Status: DC | PRN
  Administered 2016-01-15: 30 mg via INTRAMUSCULAR

## 2016-01-15 MED ORDER — DIPHENHYDRAMINE HCL 25 MG PO CAPS
25.0000 mg | ORAL_CAPSULE | ORAL | Status: DC | PRN
  Filled 2016-01-15: qty 1

## 2016-01-15 MED ORDER — WITCH HAZEL-GLYCERIN EX PADS
1.0000 | MEDICATED_PAD | CUTANEOUS | Status: DC | PRN
Start: 2016-01-15 — End: 2016-01-18

## 2016-01-15 MED ORDER — SENNOSIDES-DOCUSATE SODIUM 8.6-50 MG PO TABS
2.0000 | ORAL_TABLET | ORAL | Status: DC
Start: 2016-01-16 — End: 2016-01-18
  Administered 2016-01-15 – 2016-01-18 (×3): 2 via ORAL
  Filled 2016-01-15 (×3): qty 2

## 2016-01-15 MED ORDER — ZOLPIDEM TARTRATE 5 MG PO TABS: 5.0000 mg | ORAL_TABLET | Freq: Every evening | ORAL | Status: DC | PRN

## 2016-01-15 MED ORDER — PRENATAL MULTIVITAMIN CH
1.0000 | ORAL_TABLET | Freq: Every day | ORAL | Status: DC
  Filled 2016-01-15: qty 1

## 2016-01-15 MED ORDER — MEPERIDINE HCL 25 MG/ML IJ SOLN
INTRAMUSCULAR | Status: DC | PRN
  Administered 2016-01-15: 25 mg via INTRAVENOUS

## 2016-01-15 MED ORDER — SIMETHICONE 80 MG PO CHEW: 80.0000 mg | CHEWABLE_TABLET | ORAL | Status: DC | PRN

## 2016-01-15 MED ORDER — DIPHENHYDRAMINE HCL 50 MG/ML IJ SOLN: 12.5000 mg | INTRAMUSCULAR | Status: DC | PRN

## 2016-01-15 MED ORDER — KETOROLAC TROMETHAMINE 30 MG/ML IJ SOLN: 30.0000 mg | Freq: Four times a day (QID) | INTRAMUSCULAR | Status: DC | PRN

## 2016-01-15 MED ORDER — DEXAMETHASONE SODIUM PHOSPHATE 4 MG/ML IJ SOLN
INTRAMUSCULAR | Status: DC | PRN
  Administered 2016-01-15: 4 mg via INTRAVENOUS

## 2016-01-15 MED ORDER — SODIUM CHLORIDE 0.9 % IV SOLN: 250.0000 mL | INTRAVENOUS | Status: DC

## 2016-01-15 MED ORDER — MEASLES, MUMPS & RUBELLA VAC ~~LOC~~ INJ: 0.5000 mL | INJECTION | Freq: Once | SUBCUTANEOUS | Status: DC

## 2016-01-15 MED ORDER — MORPHINE SULFATE (PF) 0.5 MG/ML IJ SOLN
INTRAMUSCULAR | Status: AC
  Filled 2016-01-15: qty 10

## 2016-01-15 MED ORDER — SCOPOLAMINE 1 MG/3DAYS TD PT72
MEDICATED_PATCH | TRANSDERMAL | Status: AC
  Filled 2016-01-15: qty 1

## 2016-01-15 MED ORDER — CEFAZOLIN SODIUM-DEXTROSE 2-3 GM-% IV SOLR
INTRAVENOUS | Status: AC
  Filled 2016-01-15: qty 50

## 2016-01-15 MED ORDER — OXYTOCIN 10 UNIT/ML IJ SOLN
INTRAMUSCULAR | Status: AC
  Filled 2016-01-15: qty 4

## 2016-01-15 MED ORDER — MORPHINE SULFATE (PF) 0.5 MG/ML IJ SOLN
INTRAMUSCULAR | Status: DC | PRN
  Administered 2016-01-15: 4 mg via EPIDURAL

## 2016-01-15 MED ORDER — OXYTOCIN 10 UNIT/ML IJ SOLN
2.5000 [IU]/h | INTRAVENOUS | Status: AC
  Administered 2016-01-15: 2.5 [IU]/h via INTRAVENOUS

## 2016-01-15 MED ORDER — SCOPOLAMINE 1 MG/3DAYS TD PT72
MEDICATED_PATCH | TRANSDERMAL | Status: DC | PRN
  Administered 2016-01-15: 1 via TRANSDERMAL

## 2016-01-15 MED ORDER — NALBUPHINE HCL 10 MG/ML IJ SOLN: 5.0000 mg | Freq: Once | INTRAMUSCULAR | Status: DC | PRN

## 2016-01-15 MED ORDER — MEPERIDINE HCL 25 MG/ML IJ SOLN: 6.2500 mg | INTRAMUSCULAR | Status: DC | PRN

## 2016-01-15 MED ORDER — SCOPOLAMINE 1 MG/3DAYS TD PT72: 1.0000 | MEDICATED_PATCH | Freq: Once | TRANSDERMAL | Status: DC

## 2016-01-15 MED ORDER — LIDOCAINE-EPINEPHRINE (PF) 2 %-1:200000 IJ SOLN
INTRAMUSCULAR | Status: AC
  Filled 2016-01-15: qty 20

## 2016-01-15 MED ORDER — IBUPROFEN 100 MG/5ML PO SUSP
600.0000 mg | Freq: Four times a day (QID) | ORAL | Status: DC
  Filled 2016-01-15: qty 30

## 2016-01-15 MED ORDER — LIDOCAINE-EPINEPHRINE 2 %-1:100000 IJ SOLN
INTRAMUSCULAR | Status: DC | PRN
  Administered 2016-01-15 (×2): 5 mL via INTRADERMAL

## 2016-01-15 MED ORDER — KETOROLAC TROMETHAMINE 30 MG/ML IJ SOLN
INTRAMUSCULAR | Status: AC
  Filled 2016-01-15: qty 1

## 2016-01-15 MED ORDER — DIPHENHYDRAMINE HCL 25 MG PO CAPS: 25.0000 mg | ORAL_CAPSULE | Freq: Four times a day (QID) | ORAL | Status: DC | PRN

## 2016-01-15 MED ORDER — SIMETHICONE 80 MG PO CHEW
80.0000 mg | CHEWABLE_TABLET | Freq: Three times a day (TID) | ORAL | Status: DC
  Administered 2016-01-15 – 2016-01-18 (×7): 80 mg via ORAL
  Filled 2016-01-15 (×8): qty 1

## 2016-01-15 SURGICAL SUPPLY — 27 items
CLAMP CORD UMBIL (MISCELLANEOUS) IMPLANT
CLOSURE WOUND 1/2 X4 (GAUZE/BANDAGES/DRESSINGS) ×1
CLOTH BEACON ORANGE TIMEOUT ST (SAFETY) ×3 IMPLANT
DRSG OPSITE POSTOP 4X10 (GAUZE/BANDAGES/DRESSINGS) ×3 IMPLANT
DURAPREP 26ML APPLICATOR (WOUND CARE) ×3 IMPLANT
ELECT REM PT RETURN 9FT ADLT (ELECTROSURGICAL) ×3
ELECTRODE REM PT RTRN 9FT ADLT (ELECTROSURGICAL) ×1 IMPLANT
EXTRACTOR VACUUM M CUP 4 TUBE (SUCTIONS) IMPLANT
EXTRACTOR VACUUM M CUP 4' TUBE (SUCTIONS)
GLOVE BIO SURGEON STRL SZ7 (GLOVE) ×3 IMPLANT
GLOVE BIOGEL PI IND STRL 7.0 (GLOVE) ×1 IMPLANT
GLOVE BIOGEL PI INDICATOR 7.0 (GLOVE) ×2
GOWN STRL REUS W/TWL LRG LVL3 (GOWN DISPOSABLE) ×6 IMPLANT
KIT ABG SYR 3ML LUER SLIP (SYRINGE) IMPLANT
NEEDLE HYPO 25X5/8 SAFETYGLIDE (NEEDLE) ×3 IMPLANT
NS IRRIG 1000ML POUR BTL (IV SOLUTION) ×3 IMPLANT
PACK C SECTION WH (CUSTOM PROCEDURE TRAY) ×3 IMPLANT
PAD OB MATERNITY 4.3X12.25 (PERSONAL CARE ITEMS) ×3 IMPLANT
PENCIL SMOKE EVAC W/HOLSTER (ELECTROSURGICAL) ×3 IMPLANT
STRIP CLOSURE SKIN 1/2X4 (GAUZE/BANDAGES/DRESSINGS) ×2 IMPLANT
SUT CHROMIC 0 CTX 36 (SUTURE) ×9 IMPLANT
SUT MON AB 4-0 PS1 27 (SUTURE) ×3 IMPLANT
SUT PDS AB 0 CT1 27 (SUTURE) ×6 IMPLANT
SUT VIC AB 3-0 CT1 27 (SUTURE) ×4
SUT VIC AB 3-0 CT1 TAPERPNT 27 (SUTURE) ×2 IMPLANT
TOWEL OR 17X24 6PK STRL BLUE (TOWEL DISPOSABLE) ×3 IMPLANT
TRAY FOLEY CATH SILVER 14FR (SET/KITS/TRAYS/PACK) ×3 IMPLANT

## 2016-01-15 NOTE — Progress Notes (Signed)
No signif descent past zero after 2.5 hrs pushing, stable FHR>>>rec CS for CPD, procedure discussed

## 2016-01-15 NOTE — Lactation Note (Signed)
This note was copied from a baby's chart. Lactation Consultation Note  Patient Name: Victoria Maxwell [id number] Date: 01/15/2016 Reason for consult: Initial assessment  Baby 42 hours old. Mom reports that baby has been to breast 3 times since birth and is latching well. Mom return-demonstrated hand expression with colostrum visible. Enc mom to continue offering lots of STS and attempts at beast. Discussed how to support baby's head at breast in the cross-cradle position. Mom given [facility] brochure, aware of OP/BFSB and [place] phone line assistance after D/C.  Maternal Data Has patient been taught Hand Expression?: Yes Does the patient have breastfeeding experience prior to this delivery?: No  Feeding Feeding Type:  (sleeping) Length of feed: 15 min (ate longer)  LATCH Score/Interventions                      Lactation Tools Discussed/Used     Consult Status Consult Status: Follow-up Date: 01/16/16 Follow-up type: In-patient    Inocente Salles 01/15/2016, 2:15 PM

## 2016-01-15 NOTE — Anesthesia Postprocedure Evaluation (Signed)
Anesthesia Post Note  Patient: Victoria Maxwell  Procedure(s) Performed: Procedure(s) (LRB): CESAREAN SECTION (N/A)  Patient location during evaluation: Mother Baby Anesthesia Type: Epidural Level of consciousness: oriented and awake and alert Pain management: pain level controlled Vital Signs Assessment: post-procedure vital signs reviewed and stable Respiratory status: spontaneous breathing Cardiovascular status: stable Postop Assessment: patient able to bend at knees, epidural receding and adequate PO intake Anesthetic complications: no    Last Vitals:  Filed Vitals:   01/15/16 0920 01/15/16 1020  BP: 112/62 106/56  Pulse: 68 64  Temp: 36.6 C 36.5 C  Resp: 20 20    Last Pain:  Filed Vitals:   01/15/16 1320  PainSc: 1                  Yousof Alderman R

## 2016-01-15 NOTE — Anesthesia Postprocedure Evaluation (Addendum)
Anesthesia Post Note  Patient: Victoria Maxwell  Procedure(s) Performed: Procedure(s) (LRB): CESAREAN SECTION (N/A)  Patient location during evaluation: Mother Baby Anesthesia Type: Epidural Level of consciousness: awake Pain management: satisfactory to patient Vital Signs Assessment: post-procedure vital signs reviewed and stable Respiratory status: spontaneous breathing Cardiovascular status: stable Anesthetic complications: no Comments: Current pain 4, pain goal 4.  Easily met.    Last Vitals:  Filed Vitals:   01/15/16 0920 01/15/16 1020  BP: 112/62 106/56  Pulse: 68 64  Temp: 36.6 C 36.5 C  Resp: 20 20    Last Pain:  Filed Vitals:   01/15/16 1320  PainSc: 1                  Kyi Romanello

## 2016-01-15 NOTE — Addendum Note (Signed)
Addendum  created 01/15/16 1341 by Bufford Spikes, CRNA   Modules edited: Clinical Notes   Clinical Notes:  File: [id number]

## 2016-01-15 NOTE — Progress Notes (Signed)
Subjective: Postpartum Day 0: Cesarean Delivery Patient reports tolerating PO.    Objective: Vital signs in last 24 hours: Temp:  [97.5 F (36.4 C)-99.2 F (37.3 C)] 98.2 F (36.8 C) (02/22 0800) Pulse Rate:  [60-89] 65 (02/22 0800) Resp:  [14-20] 18 (02/22 0800) BP: (93-136)/(44-92) 100/56 mmHg (02/22 0800) SpO2:  [95 %-100 %] 95 % (02/22 0800)  Physical Exam:  General: alert and cooperative Lochia: appropriate Uterine Fundus: firm Incision: healing well DVT Evaluation: No evidence of DVT seen on physical exam. Negative Homan's sign. No cords or calf tenderness. No significant calf/ankle edema.   Recent Labs  01/14/16 0115  HGB 12.2  HCT 35.3*    Assessment/Plan: Status post Cesarean section. Doing well postoperatively.  Continue current care.  Nadelyn Enriques G 01/15/2016, 9:22 AM

## 2016-01-15 NOTE — Transfer of Care (Signed)
Immediate Anesthesia Transfer of Care Note  Patient: Victoria Maxwell  Procedure(s) Performed: Procedure(s): CESAREAN SECTION (N/A)  Patient Location: PACU  Anesthesia Type:Epidural  Level of Consciousness: awake, alert , oriented and patient cooperative  Airway & Oxygen Therapy: Patient Spontanous Breathing  Post-op Assessment: Report given to RN and Post -op Vital signs reviewed and stable  Post vital signs: Reviewed and stable  Last Vitals:  TEMP 97.5 BP 110/58 HR 69 RR 14 POX 99  Complications: No apparent anesthesia complications

## 2016-01-15 NOTE — Op Note (Signed)
Preoperative diagnosis: Failure to descend  Postoperative diagnosis: Same  Procedure: Primary low transverse cesarean section  Surgeon: Matthew Saras  Anesthesia: Epidural  Complications: None  EBL: 800 cc  Procedure and findings:  Patient taken the operating room after an adequate level of epidural anesthesia was obtained with the patient in left tilt position the abdomen was prepped and draped in the usual fashion, Foley catheter was arty positioned. Transverse incision made 2 finger breaths above the symphysis carried down to the fascia which was incised and extended transversely. Rectus muscles divided in the midline, peritoneum entered superiorly without incident and extended in a vertical fashion. The vesicouterine serosa was then incised and the bladder was bluntly dissected below, bladder blade repositioned. Transverse incision made in the lower segment extended with blunt dissection some moderate thin meconium was noted. The vertex was easily delivered, the infant was suctioned cord clamped and passed the pediatric team for further care. The placenta was removed manually intact, sent to pathology cord blood specimen including pH obtained.  Uterus exteriorized, cavity wiped clean with a laparotomy pack closure obtained the first layer of 0 chromic in a locked fashion followed by an imbricating layer of 0 chromic. This was hemostatic the bladder flap area was inspected carefully noted to be intact and hemostatic. Bilateral tubes and ovaries were normal. Prior to closure sponge, needle, S precast reported as correct 2 the peritoneum was closed with 3-0 Vicryl suture same used to reapproximate the rectus muscles in the midline, 0 PDS from laterally to midline on either side a closed fashion subcutaneous tissue was irrigated was fairly thin I was hemostatic was not closed separately. 4-0 Monocryl subcuticular skin closure with a honeycomb dressing. Clear urine noted at the end of the case, she  tolerated this well went to recovery room in good condition.  Dictated with [place].

## 2016-01-15 NOTE — Addendum Note (Signed)
Addendum  created 01/15/16 1341 by Asher Muir, CRNA   Modules edited: Clinical Notes   Clinical Notes:  File: [id number]; File: [id number]

## 2016-01-15 NOTE — Anesthesia Postprocedure Evaluation (Signed)
Anesthesia Post Note  Patient: KRITIKA LAPOLE  Procedure(s) Performed: Procedure(s) (LRB): CESAREAN SECTION (N/A)  Patient location during evaluation: PACU Anesthesia Type: Epidural Level of consciousness: oriented and awake and alert Pain management: pain level controlled Vital Signs Assessment: post-procedure vital signs reviewed and stable Respiratory status: spontaneous breathing and respiratory function stable Cardiovascular status: blood pressure returned to baseline and stable Postop Assessment: no headache, no backache and epidural receding Anesthetic complications: no    Last Vitals:  Filed Vitals:   01/15/16 0600 01/15/16 0615  BP: 109/64 102/63  Pulse: 70 73  Temp: 37 C   Resp: 15 16    Last Pain:  Filed Vitals:   01/15/16 0621  PainSc: 0-No pain    LLE Motor Response: No movement due to regional block (01/15/16 0615) LLE Sensation: Tingling (01/15/16 0615)   RLE Sensation: Tingling (01/15/16 0615)      Zenaida Deed

## 2016-01-15 NOTE — Consult Note (Signed)
Neonatology Note:   Attendance at C-section:    I was asked by Dr. Matthew Saras to attend this C/S at term (41 weeks). The mother is a G3P0020, GBS negative.  Failure to progress with post dates. ROM 11 hours before delivery, fluid meconium stained. Infant vigorous with good spontaneous cry and tone. Needed only minimal bulb suctioning. Ap 8,9. Lungs clear to ausc in DR. To CN to care of Pediatrician.  Jerlyn Ly, MD

## 2016-01-16 ENCOUNTER — Encounter (HOSPITAL_COMMUNITY): Payer: Self-pay | Admitting: Obstetrics and Gynecology

## 2016-01-16 LAB — CBC
HEMATOCRIT: 26.5 % — AB (ref 36.0–46.0)
HEMOGLOBIN: 9 g/dL — AB (ref 12.0–15.0)
MCH: 30.7 pg (ref 26.0–34.0)
MCHC: 34 g/dL (ref 30.0–36.0)
MCV: 90.4 fL (ref 78.0–100.0)
Platelets: 140 10*3/uL — ABNORMAL LOW (ref 150–400)
RBC: 2.93 MIL/uL — AB (ref 3.87–5.11)
RDW: 14.1 % (ref 11.5–15.5)
WBC: 13.5 10*3/uL — ABNORMAL HIGH (ref 4.0–10.5)

## 2016-01-16 NOTE — Progress Notes (Signed)
Subjective: Postpartum Day 1: Cesarean Delivery Patient reports tolerating PO and no problems voiding.    Objective: Vital signs in last 24 hours: Temp:  [97.7 F (36.5 C)-98.2 F (36.8 C)] 98.2 F (36.8 C) (02/23 0610) Pulse Rate:  [64-82] 80 (02/23 0610) Resp:  [18-20] 18 (02/23 0610) BP: (101-112)/(50-62) 104/57 mmHg (02/23 0610) SpO2:  [96 %-98 %] 98 % (02/23 0610)  Physical Exam:  General: alert and cooperative Lochia: appropriate Uterine Fundus: firm Incision: healing well DVT Evaluation: No evidence of DVT seen on physical exam. Negative Homan's sign. No cords or calf tenderness. No significant calf/ankle edema.   Recent Labs  01/14/16 0115 01/16/16 0515  HGB 12.2 9.0*  HCT 35.3* 26.5*    Assessment/Plan: Status post Cesarean section. Doing well postoperatively.  Continue current care.  CURTIS,CAROL G 01/16/2016, 8:35 AM  Agree with above.  Patient is counseled for circ including risk of bleeding, infection and scarring.  All questions were answered and the patient wishes to proceed.  Linda Hedges, DO

## 2016-01-16 NOTE — Lactation Note (Signed)
This note was copied from a baby's chart. Lactation Consultation Note  Patient Name: Victoria Maxwell [id number] Date: 01/16/2016 Reason for consult: Follow-up assessment Baby at 40 hr of life and has not had a BM. Mom stated baby is latching well and she can see/hear swallows while he is feeding. Had mom hand express into a spoon and fed back 2 ml. Mom has good colostrum flow. Mom will offer the breast 8+/24hr and offer her expressed milk after feedings.   Maternal Data    Feeding Feeding Type: Breast Fed Length of feed: 30 min  LATCH Score/Interventions                      Lactation Tools Discussed/Used     Consult Status Consult Status: Follow-up Date: 01/17/16 Follow-up type: In-patient    Denzil Hughes 01/16/2016, 9:35 PM

## 2016-01-17 NOTE — Lactation Note (Signed)
This note was copied from a baby's chart. Lactation Consultation Note  Patient Name: Victoria Maxwell [id number] Date: 01/17/2016 Reason for consult: Follow-up assessment Mom reports baby is nursing well and she hears swallows. Did not see latch at this visit. Baby has been to the breast 11 times in the past 24 hours for 15-30 minutes, 3 voids in 24 hours, 7 voids in life. No stool since birth, MSF at delivery. KUB WNL. Bili 6.9 at 42  hours which is in the low risk zone. Mom denies discomfort with BF, does report having to adjust bottom lip with feedings but seems confident with BF so far. LC did encourage Mom to call for latch check with feeding this evening. Advised to continue to BF with feeding ques, 8-12 times or more in 24 hours.  Mom denies other questions/concerns.   Maternal Data    Feeding Feeding Type: Breast Fed Length of feed: 30 min  LATCH Score/Interventions                      Lactation Tools Discussed/Used     Consult Status Consult Status: Follow-up Date: 01/17/16 Follow-up type: In-patient    Katrine Coho 01/17/2016, 4:05 PM

## 2016-01-17 NOTE — Progress Notes (Signed)
Subjective: Postpartum Day 2: Cesarean Delivery Patient reports tolerating PO, + flatus, + BM and no problems voiding.    Objective: Vital signs in last 24 hours: Temp:  [98.1 F (36.7 C)-98.3 F (36.8 C)] 98.3 F (36.8 C) (02/24 0606) Pulse Rate:  [83-93] 83 (02/24 0606) Resp:  [17-18] 18 (02/24 0606) BP: (96-106)/(52-58) 96/54 mmHg (02/24 0606) SpO2:  [97 %] 97 % (02/23 1011)  Physical Exam:  General: alert, cooperative and no distress Lochia: appropriate Uterine Fundus: firm Incision: healing well DVT Evaluation: No evidence of DVT seen on physical exam.   Recent Labs  01/16/16 0515  HGB 9.0*  HCT 26.5*    Assessment/Plan: Status post Cesarean section. Doing well postoperatively.  Continue current care Will D/C home if baby is discharged. D/W patient D/C instructions Percocet 5/325, #20 NR written prescription given  Mikhaela Zaugg II,Danen Lapaglia E 01/17/2016, 8:23 AM

## 2016-01-18 MED ORDER — FERROUS SULFATE 325 (65 FE) MG PO TABS: 325.0000 mg | ORAL_TABLET | Freq: Every day | ORAL | Status: AC

## 2016-01-18 MED ORDER — IBUPROFEN 100 MG/5ML PO SUSP: 600.0000 mg | Freq: Four times a day (QID) | ORAL | Status: AC | PRN

## 2016-01-18 NOTE — Discharge Instructions (Signed)
°Iron-Rich Diet ° °Iron is a mineral that helps your body to produce hemoglobin. Hemoglobin is a protein in your red blood cells that carries oxygen to your body's tissues. Eating too little iron may cause you to feel weak and tired, and it can increase your risk for infection. Eating enough iron is necessary for your body's metabolism, muscle function, and nervous system. °Iron is naturally found in many foods. It can also be added to foods or fortified in foods. There are two types of dietary iron: °· Heme iron. Heme iron is absorbed by the body more easily than nonheme iron. Heme iron is found in meat, poultry, and fish. °· Nonheme iron. Nonheme iron is found in dietary supplements, iron-fortified grains, beans, and vegetables. °You may need to follow an iron-rich diet if: °· You have been diagnosed with iron deficiency or iron-deficiency anemia. °· You have a condition that prevents you from absorbing dietary iron, such as: °¨ Infection in your intestines. °¨ Celiac disease. This involves long-lasting (chronic) inflammation of your intestines. °· You do not eat enough iron. °· You eat a diet that is high in foods that impair iron absorption. °· You have lost a lot of blood. °· You have heavy bleeding during your menstrual cycle. °· You are pregnant. °WHAT IS MY PLAN? °Your health care provider may help you to determine how much iron you need per day based on your condition. Generally, when a person consumes sufficient amounts of iron in the diet, the following iron needs are met: °· Men. °¨ 14-18 years old: 11 mg per day. °¨ 19-50 years old: 8 mg per day. °· Women.   °¨ 14-18 years old: 15 mg per day. °¨ 19-50 years old: 18 mg per day. °¨ Over 50 years old: 8 mg per day. °¨ Pregnant women: 27 mg per day. °¨ Breastfeeding women: 9 mg per day. °WHAT DO I NEED TO KNOW ABOUT AN IRON-RICH DIET? °· Eat fresh fruits and vegetables that are high in vitamin C along with foods that are high in iron. This will help  increase the amount of iron that your body absorbs from food, especially with foods containing nonheme iron. Foods that are high in vitamin C include oranges, peppers, tomatoes, and mango. °· Take iron supplements only as directed by your health care provider. Overdose of iron can be life-threatening. If you were prescribed iron supplements, take them with orange juice or a vitamin C supplement. °· Cook foods in pots and pans that are made from iron.   °· Eat nonheme iron-containing foods alongside foods that are high in heme iron. This helps to improve your iron absorption.   °· Certain foods and drinks contain compounds that impair iron absorption. Avoid eating these foods in the same meal as iron-rich foods or with iron supplements. These include: °¨ Coffee, black tea, and red wine. °¨ Milk, dairy products, and foods that are high in calcium. °¨ Beans, soybeans, and peas. °¨ Whole grains. °· When eating foods that contain both nonheme iron and compounds that impair iron absorption, follow these tips to absorb iron better.   °¨ Soak beans overnight before cooking. °¨ Soak whole grains overnight and drain them before using. °¨ Ferment flours before baking, such as using yeast in bread dough. °WHAT FOODS CAN I EAT? °Grains  °Iron-fortified breakfast cereal. Iron-fortified whole-wheat bread. Enriched rice. Sprouted grains. °Vegetables  °Spinach. Potatoes with skin. Green peas. Broccoli. Red and green bell peppers. Fermented vegetables. °Fruits  °Prunes. Raisins. Oranges. Strawberries. Mango. Grapefruit. °Meats and Other   Protein Sources Beef liver. Oysters. Beef. Shrimp. [place]. Chicken. [place]. Sardines. Chickpeas. Nuts. Tofu. Beverages Tomato juice. Fresh orange juice. Prune juice. Hibiscus tea. Fortified instant breakfast shakes. Condiments Tahini. Fermented soy sauce. Sweets and Desserts Black-strap molasses.  Other Wheat germ. The items listed above may not be a complete list of recommended foods or  beverages. Contact your dietitian for more options. WHAT FOODS ARE NOT RECOMMENDED? Grains Whole grains. Bran cereal. Bran flour. Oats. Vegetables Artichokes. Brussels sprouts. Kale. Fruits Blueberries. Raspberries. Strawberries. Figs. Meats and Other Protein Sources Soybeans. Products made from soy protein. Dairy Milk. Cream. Cheese. Yogurt. Cottage cheese. Beverages Coffee. Black tea. Red wine. Sweets and Desserts Cocoa. Chocolate. Ice cream. Other Basil. Oregano. Parsley. The items listed above may not be a complete list of foods and beverages to avoid. Contact your dietitian for more information.   This information is not intended to replace advice given to you by your health care provider. Make sure you discuss any questions you have with your health care provider.   Document Released: 06/23/2005 Document Revised: 11/30/2014 Document Reviewed: 06/06/2014 Elsevier Interactive Patient Education [employer].

## 2016-01-18 NOTE — Lactation Note (Addendum)
This note was copied from a baby's chart. Lactation Consultation Note  Patient Name: Victoria Maxwell [id number] Date: 01/18/2016 Reason for consult: Follow-up assessment  Baby is 83 hours old , mec in utero. Baby has not stooled since. 2% weight loss,  Moms milk is in breast heavier , fuller. Baby awake and hungry. LC assisted with latch in football position, depth obtained and multiply swallows  Noted, increased with breast compressions. Breast softened and cooled down.  LC observed 15 mins and baby still feeding.  Mom denies sore nipples, even though they are pinky red , per mom normal color.  LC instructed mom on the use hand pump , and noted the #24 Flange to be ok for today  But when the milk comes in will need #27 Flange, ( 2 provided ).  Per mom has  DEBP at home. Pershing Proud)  Mother informed of post-discharge support and given phone number to the lactation department, including services for phone call assistance; out-patient appointments; and breastfeeding support group. List of other breastfeeding resources in the community given in the handout. Encouraged mother to call for problems or concerns related to breastfeeding.   Maternal Data Has patient been taught Hand Expression?: Yes  Feeding Feeding Type: Breast Fed  LATCH Score/Interventions Latch: Grasps breast easily, tongue down, lips flanged, rhythmical sucking. Intervention(s): Adjust position;Assist with latch;Breast massage;Breast compression  Audible Swallowing: Spontaneous and intermittent  Type of Nipple: Everted at rest and after stimulation  Comfort (Breast/Nipple): Filling, red/small blisters or bruises, mild/mod discomfort  Problem noted: Filling  Hold (Positioning): Assistance needed to correctly position infant at breast and maintain latch. Intervention(s): Breastfeeding basics reviewed;Support Pillows;Position options;Skin to skin  LATCH Score: 8  Lactation Tools Discussed/Used Tools:  Pump;Flanges Flange Size: 27 Breast pump type: Manual WIC Program: No Pump Review: Setup, frequency, and cleaning;Milk Storage   Consult Status Consult Status: Complete Date: 01/18/16 Follow-up type: In-patient    Myer Haff 01/18/2016, 10:41 AM

## 2016-01-18 NOTE — Progress Notes (Signed)
Subjective: Postpartum Day 3: Cesarean Delivery Patient reports tolerating PO, + flatus and no problems voiding.    Objective: Vital signs in last 24 hours: Temp:  [98.2 F (36.8 C)-98.8 F (37.1 C)] 98.8 F (37.1 C) (02/25 0626) Pulse Rate:  [69-73] 69 (02/25 0626) Resp:  [18] 18 (02/25 0626) BP: (93-102)/(57-80) 93/80 mmHg (02/25 [id number])  Physical Exam:  General: alert, cooperative and no distress Lochia: appropriate Uterine Fundus: firm Incision: healing well DVT Evaluation: No evidence of DVT seen on physical exam.   Recent Labs  01/16/16 0515  HGB 9.0*  HCT 26.5*    Assessment/Plan: Status post Cesarean section. Doing well postoperatively.  Discharge home with standard precautions and return to clinic in 4-6 weeks.  Osamah Schmader II,Theophile Harvie E 01/18/2016, 8:54 AM

## 2016-01-18 NOTE — Discharge Summary (Signed)
Obstetric Discharge Summary Reason for Admission: induction of labor Prenatal Procedures: none Intrapartum Procedures: cesarean: low cervical, transverse Postpartum Procedures: none Complications-Operative and Postpartum: none HEMOGLOBIN  Date Value Ref Range Status  01/16/2016 9.0* 12.0 - 15.0 g/dL Final   HCT  Date Value Ref Range Status  01/16/2016 26.5* 36.0 - 46.0 % Final    Physical Exam:  General: alert, cooperative and no distress Lochia: appropriate Uterine Fundus: firm Incision: healing well DVT Evaluation: No evidence of DVT seen on physical exam.  Discharge Diagnoses: Term Pregnancy-delivered  Discharge Information: Date: 01/18/2016 Activity: pelvic rest Diet: routine Medications: PNV, Ibuprofen and Iron Condition: stable Instructions: refer to practice specific booklet Discharge to: home   Newborn Data: Live born female  Birth Weight: 8 lb 1.1 oz (3660 g) APGAR: 8, 9  Home with mother.  Victoria Maxwell,Victoria Maxwell 01/18/2016, 8:58 AM

## 2019-09-15 ENCOUNTER — Other Ambulatory Visit: Payer: Self-pay

## 2019-09-15 DIAGNOSIS — Z20822 Contact with and (suspected) exposure to covid-19: Secondary | ICD-10-CM

## 2019-09-16 LAB — NOVEL CORONAVIRUS, NAA: SARS-CoV-2, NAA: NOT DETECTED

## 2023-03-18 ENCOUNTER — Other Ambulatory Visit: Payer: Self-pay | Admitting: Obstetrics and Gynecology

## 2023-03-18 DIAGNOSIS — N6310 Unspecified lump in the right breast, unspecified quadrant: Secondary | ICD-10-CM

## 2023-03-30 ENCOUNTER — Ambulatory Visit
Admission: RE | Admit: 2023-03-30 | Discharge: 2023-03-30 | Disposition: A | Discharge status: Home / Self Care | Payer: 59 | Source: Ambulatory Visit | Attending: Obstetrics and Gynecology | Admitting: Obstetrics and Gynecology

## 2023-03-30 ENCOUNTER — Ambulatory Visit
Admission: RE | Admit: 2023-03-30 | Discharge: 2023-03-30 | Disposition: A | Discharge status: Home / Self Care | Payer: No Typology Code available for payment source | Source: Ambulatory Visit | Attending: Obstetrics and Gynecology | Admitting: Obstetrics and Gynecology

## 2023-03-30 DIAGNOSIS — N6310 Unspecified lump in the right breast, unspecified quadrant: Secondary | ICD-10-CM
